# Patient Record
Sex: Female | Born: 1970 | Race: Black or African American | Hispanic: Yes | Marital: Married | Smoking: Never smoker
Health system: Southern US, Community
[De-identification: ages and names within clinical notes are randomized; demographics above are authoritative.]

## PROBLEM LIST (undated history)

## (undated) DIAGNOSIS — N200 Calculus of kidney: Secondary | ICD-10-CM

## (undated) DIAGNOSIS — R87619 Unspecified abnormal cytological findings in specimens from cervix uteri: Secondary | ICD-10-CM

## (undated) DIAGNOSIS — Z8619 Personal history of other infectious and parasitic diseases: Secondary | ICD-10-CM

## (undated) DIAGNOSIS — IMO0002 Reserved for concepts with insufficient information to code with codable children: Secondary | ICD-10-CM

## (undated) DIAGNOSIS — K219 Gastro-esophageal reflux disease without esophagitis: Secondary | ICD-10-CM

## (undated) HISTORY — DX: Gastro-esophageal reflux disease without esophagitis: K21.9

## (undated) HISTORY — PX: LEEP: SHX91

## (undated) HISTORY — DX: Calculus of kidney: N20.0

## (undated) HISTORY — DX: Reserved for concepts with insufficient information to code with codable children: IMO0002

## (undated) HISTORY — DX: Unspecified abnormal cytological findings in specimens from cervix uteri: R87.619

## (undated) HISTORY — DX: Personal history of other infectious and parasitic diseases: Z86.19

---

## 1991-11-24 HISTORY — PX: CHOLECYSTECTOMY: SHX55

## 1999-08-25 ENCOUNTER — Other Ambulatory Visit: Admission: RE | Admit: 1999-08-25 | Discharge: 1999-08-25 | Payer: Self-pay | Admitting: Obstetrics and Gynecology

## 2000-06-27 ENCOUNTER — Other Ambulatory Visit: Admission: RE | Admit: 2000-06-27 | Discharge: 2000-06-27 | Payer: Self-pay | Admitting: Obstetrics and Gynecology

## 2000-07-09 ENCOUNTER — Encounter: Payer: Self-pay | Admitting: Obstetrics and Gynecology

## 2000-07-09 ENCOUNTER — Inpatient Hospital Stay (HOSPITAL_COMMUNITY): Admission: AD | Admit: 2000-07-09 | Discharge: 2000-07-09 | Payer: Self-pay | Admitting: Obstetrics and Gynecology

## 2001-01-17 ENCOUNTER — Observation Stay (HOSPITAL_COMMUNITY): Admission: AD | Admit: 2001-01-17 | Discharge: 2001-01-17 | Payer: Self-pay | Admitting: Obstetrics and Gynecology

## 2001-01-17 ENCOUNTER — Encounter: Payer: Self-pay | Admitting: Obstetrics and Gynecology

## 2001-01-19 ENCOUNTER — Inpatient Hospital Stay (HOSPITAL_COMMUNITY): Admission: AD | Admit: 2001-01-19 | Discharge: 2001-01-22 | Payer: Self-pay | Admitting: Obstetrics and Gynecology

## 2001-01-25 ENCOUNTER — Inpatient Hospital Stay (HOSPITAL_COMMUNITY): Admission: AD | Admit: 2001-01-25 | Discharge: 2001-01-25 | Payer: Self-pay | Admitting: Obstetrics and Gynecology

## 2001-08-17 ENCOUNTER — Emergency Department (HOSPITAL_COMMUNITY): Admission: EM | Admit: 2001-08-17 | Discharge: 2001-08-17 | Payer: Self-pay | Admitting: *Deleted

## 2001-08-17 ENCOUNTER — Encounter: Payer: Self-pay | Admitting: *Deleted

## 2002-10-09 ENCOUNTER — Other Ambulatory Visit: Admission: RE | Admit: 2002-10-09 | Discharge: 2002-10-09 | Payer: Self-pay | Admitting: Obstetrics and Gynecology

## 2003-09-13 ENCOUNTER — Inpatient Hospital Stay (HOSPITAL_COMMUNITY): Admission: AD | Admit: 2003-09-13 | Discharge: 2003-09-14 | Payer: Self-pay | Admitting: Obstetrics and Gynecology

## 2003-11-09 ENCOUNTER — Other Ambulatory Visit: Admission: RE | Admit: 2003-11-09 | Discharge: 2003-11-09 | Payer: Self-pay | Admitting: Obstetrics and Gynecology

## 2003-12-04 ENCOUNTER — Inpatient Hospital Stay (HOSPITAL_COMMUNITY): Admission: AD | Admit: 2003-12-04 | Discharge: 2003-12-06 | Payer: Self-pay | Admitting: Obstetrics and Gynecology

## 2003-12-04 ENCOUNTER — Encounter (INDEPENDENT_AMBULATORY_CARE_PROVIDER_SITE_OTHER): Payer: Self-pay | Admitting: Specialist

## 2003-12-07 ENCOUNTER — Encounter: Admission: RE | Admit: 2003-12-07 | Discharge: 2004-01-06 | Payer: Self-pay | Admitting: Obstetrics and Gynecology

## 2003-12-11 ENCOUNTER — Inpatient Hospital Stay (HOSPITAL_COMMUNITY): Admission: AD | Admit: 2003-12-11 | Discharge: 2003-12-11 | Payer: Self-pay | Admitting: Obstetrics and Gynecology

## 2003-12-14 ENCOUNTER — Inpatient Hospital Stay (HOSPITAL_COMMUNITY): Admission: AD | Admit: 2003-12-14 | Discharge: 2003-12-14 | Payer: Self-pay | Admitting: Obstetrics and Gynecology

## 2004-01-07 ENCOUNTER — Encounter: Admission: RE | Admit: 2004-01-07 | Discharge: 2004-02-06 | Payer: Self-pay | Admitting: Obstetrics and Gynecology

## 2004-11-23 ENCOUNTER — Other Ambulatory Visit: Admission: RE | Admit: 2004-11-23 | Discharge: 2004-11-23 | Payer: Self-pay | Admitting: Obstetrics and Gynecology

## 2006-05-23 ENCOUNTER — Other Ambulatory Visit: Admission: RE | Admit: 2006-05-23 | Discharge: 2006-05-23 | Payer: Self-pay | Admitting: Obstetrics and Gynecology

## 2006-11-07 ENCOUNTER — Encounter: Payer: Self-pay | Admitting: Internal Medicine

## 2008-04-24 ENCOUNTER — Inpatient Hospital Stay (HOSPITAL_COMMUNITY): Admission: AD | Admit: 2008-04-24 | Discharge: 2008-04-26 | Payer: Self-pay | Admitting: Obstetrics and Gynecology

## 2008-04-28 ENCOUNTER — Encounter: Admission: RE | Admit: 2008-04-28 | Discharge: 2008-05-28 | Payer: Self-pay | Admitting: Obstetrics and Gynecology

## 2008-05-06 ENCOUNTER — Ambulatory Visit: Admission: RE | Admit: 2008-05-06 | Discharge: 2008-05-06 | Payer: Self-pay | Admitting: Obstetrics and Gynecology

## 2008-05-29 ENCOUNTER — Encounter: Admission: RE | Admit: 2008-05-29 | Discharge: 2008-06-08 | Payer: Self-pay | Admitting: Obstetrics and Gynecology

## 2009-06-15 ENCOUNTER — Encounter: Payer: Self-pay | Admitting: Emergency Medicine

## 2010-02-23 ENCOUNTER — Encounter: Payer: Self-pay | Admitting: Nurse Practitioner

## 2010-02-23 ENCOUNTER — Encounter: Admission: RE | Admit: 2010-02-23 | Discharge: 2010-02-23 | Payer: Self-pay | Admitting: Internal Medicine

## 2010-04-21 ENCOUNTER — Encounter: Payer: Self-pay | Admitting: Nurse Practitioner

## 2010-05-17 ENCOUNTER — Ambulatory Visit: Payer: Self-pay | Admitting: Vascular Surgery

## 2010-06-27 ENCOUNTER — Encounter: Payer: Self-pay | Admitting: Internal Medicine

## 2010-07-12 ENCOUNTER — Encounter
Admission: RE | Admit: 2010-07-12 | Discharge: 2010-07-12 | Payer: Self-pay | Source: Home / Self Care | Attending: Obstetrics and Gynecology | Admitting: Obstetrics and Gynecology

## 2010-11-29 NOTE — Consult Note (Signed)
NEW PATIENT CONSULTATION   Sarah Roman, Sarah Roman  DOB:  Nov 24, 1970                                       05/17/2010  ZOXWR#:[id number]   Patient is referred for swelling in her left ankle.  She has no history  of deep venous thrombosis, thrombophlebitis, or any blood clots in the  past.  Her first pregnancy was 9 years ago, and she developed some  swelling in the left ankle and foot the knee during the pregnancy, which  resolved following the pregnancy.  The second pregnancy, the same events  occurred, and on the third pregnancy a couple of years ago, she has  continued to have swelling in the ankle and foot on the left side.  She  does not have calf or thigh swelling.  She has no history of varicose  veins, stasis ulcers, bleeding, or other problems.  She is able to  ambulate long distances.   CHRONIC MEDICAL PROBLEMS:  Borderline hyperlipidemia.  Negative for  diabetes, hypertension, coronary artery disease, COPD, or stroke.   SOCIAL HISTORY:  She is a self-employed Airline pilot.  Is married, has 3  children.  Does not use tobacco or alcohol.   FAMILY HISTORY:  Positive for peripheral vascular disease and diabetes  in her mother.  Negative for coronary artery disease or stroke.   REVIEW OF SYSTEMS:  Positive for occasional diarrhea and constipation.  All other systems are negative in the complete review of systems.   PHYSICAL EXAMINATION:  Blood pressure 121/82, heart rate 72,  respirations 14.  General:  A well-developed and well-nourished female  in no apparent distress, alert and oriented x3.  HEENT:  Normal for age.  EOMs intact.  Lungs:  Clear to auscultation.  No rhonchi or wheezing.  Cardiovascular:  Regular rhythm.  No murmurs.  Carotid pulses are 3+.  No bruits.  Abdomen:  Soft, nontender with no palpable masses.  Musculoskeletal:  Free of major deformities.  Neurologic:  Normal.  Skin:  Free of rashes.  Lower extremity exam reveals 3+ femoral,  popliteal, and dorsalis pedis pulses.  No varicosities are noted.  There  is no hyperpigmentation or ulceration.  There is mild edema in the left  ankle which measures 1 cm, larger than the right side, with no calf or  thigh asymmetry.   Today I ordered a venous duplex exam, which I have reviewed and  interpreted.  She has some very mild reflux in her deep system on the  left.  There are no valvular __________ in the superficial system and no  deep venous obstruction.   I discussed these findings with patient and have recommended elevation  of her legs at night and to wear short-leg elastic compression stockings  during the day to help control the edema.  No other treatment or  evaluation is needed.  She will return to see [place] on p.r.Roman. basis.     Quita Skye Hart Rochester, M.D.  Electronically Signed   JDL/MEDQ  D:  05/17/2010  T:  05/18/2010  Job:  [id number]   cc:   Merlene Laughter. Renae Gloss, M.D.

## 2010-11-29 NOTE — Procedures (Signed)
LOWER EXTREMITY VENOUS REFLUX EXAM   INDICATION:  Left ankle edema.   EXAM:  Using color-flow imaging and pulse Doppler spectral analysis, the  left common femoral, superficial femoral, popliteal, posterior tibial,  greater and lesser saphenous veins are evaluated.  There is evidence  suggesting deep venous insufficiency in the left lower extremity.   The left saphenofemoral junction is competent. The left GSV is  competent.   The left proximal short saphenous vein demonstrates competency.   GSV Diameter (used if found to be incompetent only)                                            Right    Left  Proximal Greater Saphenous Vein           cm       cm  Proximal-to-mid-thigh                     cm       cm  Mid thigh                                 cm       cm  Mid-distal thigh                          cm       cm  Distal thigh                              cm       cm  Knee                                      cm       cm   IMPRESSION:  1. The left greater saphenous vein is competent.  2. The left greater saphenous vein is not tortuous.  3. The deep venous system is not competent with Reflux of      >560milliseconds.  4. The left short saphenous vein is competent.   ___________________________________________  Quita Skye. Hart [place], M.D.   EM/MEDQ  D:  05/17/2010  T:  05/18/2010  Job:  [id number]

## 2010-11-29 NOTE — H&P (Signed)
NAMEGARYN, WAGUESPACK          ACCOUNT NO.:  [id number]   MEDICAL RECORD NO.:  [id number]          PATIENT TYPE:  INP   LOCATION:  9108                          FACILITY:  [facility]   PHYSICIAN:  Janine Limbo, M.D.DATE OF BIRTH:  03/18/1971   DATE OF ADMISSION:  04/24/2008  DATE OF DISCHARGE:                              HISTORY & PHYSICAL   This is a 40 year old gravida 3, para 2-0-0-2 at 39 weeks, who presents  with pain and cramping and contractions.  Cervix was initially 3 cm and  progressed to 5 cm, although her contraction pattern is not consistent.  Pregnancy has been followed by Dr. Pennie Rushing and is remarkable for;  1. History of postpartum hypertension.  2. AMA.  3. Obesity.  4. History of LEEP.  5. HSV II.  6. Daughter has developmental delays.  7. Group B strep negative.   PRENATAL LABS:  Hemoglobin 12.3, platelets 367, blood type O positive,  antibody screen negative, sickle cell negative, RPR nonreactive, rubella  immune, hepatitis negative, HIV negative, Pap test normal, gonorrhea  negative, chlamydia negative, and cyst fibrosis negative.   HISTORY OF CURRENT PREGNANCY:  She had an ultrasound in the first  trimester to correct her dates.  First trimester screening was normal.  She declined amnio.  She had an ultrasound for anatomy at 18 weeks that  was normal.  Glucola at 27 weeks was also normal.  She had an ultrasound  at 29 weeks showing normal growth and normal fluid, and she was group B  strep negative at term.   OB HISTORY:  She had a vacuum extraction in 2002 of a female infant  weighing 6 pounds 13 ounces at 55 weeks' gestation with no  complications.   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  She had a history of elevated blood pressure in 1 week after  delivery with her first child.  She had a LEEP procedure in 1996 and  occasional episodes of cystitis.   SURGICAL HISTORY:  Remarkable for gallbladder in 1993,  wisdom teeth in  1997, and a LEEP in 1996.   FAMILY HISTORY:  Remarkable for an aunt with MI, parents and grandmother  with hypertension, mother with varicosities,  father with diabetes,  grandmother with diabetes, grandmother with stroke, and grandfather with  prostate cancer.   GENETIC HISTORY:  Remarkable for mother with polydactyly, father of the  baby has sickle cell trait positive, 2 cousins have mental retardation,  and her daughter has developmental delay.   SOCIAL HISTORY:  The patient is married to [place], who is involved and  supportive.  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  She denies alcohol, tobacco, or  drug use.   OBJECTIVE DATA:  VITAL SIGNS:  Stable, afebrile.  HEENT:  Within normal limits.  NECK:  Thyroid normal and not enlarged.  CHEST:  Clear to auscultation.  HEART:  Regular rate and rhythm.  ABDOMEN:  Gravid.  Vertex Leopold's exam shows reactive fetal heart rate with contractions  every 2-5 minutes, which  are mild.  Cervix changed from 3 cm to 5 cm and  is 90% effaced and -1 station with bulging membranes.  EXTREMITIES:  Within normal limits.   ASSESSMENT:  1. Intrauterine pregnancy at 39 weeks.  2. Labor.  3. Advanced dilation.   PLAN:  1. Admit to birthing suites per Dr. Stefano Gaul.  2. Routine MD orders.  3. Low-dose Pitocin  4. Epidural p.r.n.      Marie L. Mayford Knife, C.N.M.      ______________________________  Janine Limbo, M.D.    MLW/MEDQ  D:  04/24/2008  T:  04/25/2008  Job:  [id number]

## 2010-12-02 NOTE — H&P (Signed)
NAME:  Sarah Roman, Sarah Roman                    ACCOUNT NO.:  [id number]   MEDICAL RECORD NO.:  [id number]                   PATIENT TYPE:  INP   LOCATION:  9166                                 FACILITY:  [facility]   PHYSICIAN:  Hal Morales, M.D.             DATE OF BIRTH:  January 12, 1971   DATE OF ADMISSION:  12/04/2003  DATE OF DISCHARGE:                                HISTORY & PHYSICAL   HISTORY OF PRESENT ILLNESS:  Sarah Roman is a 40 year old married  black female gravida 2 para 1-0-0-1 at 38 and four-sevenths weeks who  presents to the office with uterine contractions approximately every 7  minutes.  She reports questionable leaking fluid since this morning.  She  denies any bleeding, reports positive fetal movement, and denies any HSV  signs or symptoms.  Her pregnancy has been followed by the [facility]  OB/GYN M.D. service and has been remarkable for:  1. History of LEEP.  2. History of HSV.  3. History of increased blood pressure after delivery.   Her prenatal labs were collected on May 18, 2003.  Hemoglobin 12.1;  hematocrit 37.4; platelets 393,000.  Blood type O positive, antibody  negative.  RPR nonreactive.  Rubella immune.  Hepatitis B surface antigen  negative.  Pap smear within normal limits from March 2004.  Gonorrhea  negative, chlamydia negative.  Quad screen from June 30, 2003 was within  normal limits.  Her 1-hour Glucola on September 02, 2003 was 93 and her RPR  was nonreactive at that time and her hemoglobin was 12.7.  Culture of the  vaginal tract for gonorrhea, chlamydia, and group B strep on November 09, 2003  were all negative.   HISTORY OF PRESENT PREGNANCY:  The patient presented for care at [facility] on May 18, 2003 at [redacted] weeks gestation.  Pregnancy  ultrasonography at [redacted] weeks gestation showed normal anatomy and growth  consistent with previous dating.  The patient began taking Valtrex at [redacted]  weeks gestation for HSV  prophylaxis.  The rest of her prenatal care was  unremarkable.   OBSTETRICAL HISTORY:  She is a gravida 2 para 1-0-0-1.  In July 2002 she had  a vacuum extraction for a female infant weighing 6 pounds 13 ounces at 39 and  five-sevenths weeks gestation after 10 hours in labor.  She had an epidural  for anesthesia and his name was Jalen and the vacuum extraction was  secondary to maternal exhaustion.   MEDICAL HISTORY:  She has no medication allergies.  She was diagnosed with  HSV 2 and has approximately two to three outbreaks per year.  She had  elevated blood pressures approximately 1 week after delivery of her first  child and was started on labetalol.  She has used oral contraceptives in the  past.  She had a LEEP procedure in 1996 with normal Pap smears since.  She  has an occasional episode of cystitis.  SURGICAL HISTORY:  Remarkable for gallbladder in 1993 and wisdom teeth  extraction in 1997.   FAMILY MEDICAL HISTORY:  Remarkable for maternal aunt with myocardial  infarction.  Both parents and maternal grandmother on medications for  hypertension.  Mother with varicosities.  Father with diabetes and maternal  grandmother with insulin-dependent diabetes.  Maternal grandmother with  history of stroke.  Paternal grandfather with prostate cancer.   GENETIC HISTORY:  Remarkable for mother with polydactyly.  Father of the  baby is sickle cell trait positive.  Two maternal cousins with mental  retardation.   SOCIAL HISTORY:  The patient is married to the father of the baby.  His name  is Earna Coder.  He is involved and supportive.  Both are college educated.  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  They  deny any alcohol, tobacco, or illicit drug use with the pregnancy.   OBJECTIVE DATA:  VITAL SIGNS:  Stable.  Blood pressure is 140/80; the  patient is afebrile.  HEENT:  Grossly within normal limits.  CHEST:  Clear to auscultation.  HEART:  Regular  rate and rhythm.  ABDOMEN:  Gravid in contour with fundal height extending approximately 38 cm  above the pubic symphysis.  Fetal heart rate was in the 150s with Doppler.  Uterine contractions palpate moderate, approximately every 7 minutes.  PELVIC:  Sterile speculum exam shows no evidence of ruptured membranes,  negative nitrazine, negative ferning, with a small amount of vaginal  diagnosis present.  No signs or symptoms of HSV lesion.  Cervical exam is 4-  5 cm, 100% effaced, vertex at -2, with slightly bulging bag of water.  EXTREMITIES:  Within normal limits   ASSESSMENT:  1. Intrauterine pregnancy at term.  2. Early active labor.  3. Group B streptococcus negative.   PLAN:  1. Admit to birthing suites per consult with Dr. Pennie Rushing.  2. Routine M.D. orders.  3. Anticipate spontaneous vaginal delivery.     Cam Hai, C.N.M.                     Hal Morales, M.D.    KS/MEDQ  D:  12/04/2003  T:  12/04/2003  Job:  [id number]

## 2010-12-02 NOTE — Discharge Summary (Signed)
[facility]  Patient:    Sarah Roman, Sarah Roman                 MRN: [id number] Adm. Date:  [id number] Disc. Date: 01/22/01 Attending:  Shaune Spittle Dictator:   Wynelle Bourgeois, C.N.M.                           Discharge Summary  ADMISSION DIAGNOSES:          1. Intrauterine pregnancy at 39 5/7 weeks.                               2. Abdominal and back pain.                               3. Scleral jaundice with elevated total                                  bilirubin.                               4. Spontaneous premature rupture of membranes at                                  term of unknown duration.                               5. Early labor.  DISCHARGE DIAGNOSES:          1. Intrauterine pregnancy at 39 5/7 weeks.                               2. Abdominal and back pain.                               3. Scleral jaundice with elevated total                                  bilirubin.                               4. Spontaneous premature rupture of membranes at                                  term of unknown duration.                               5. Early labor.                               6. Status post vacuum assisted vaginal delivery  of a viable female infant named Earley Abide                                  weighing 6 pounds 13 ounces, Apgars 9 and 9.  PROCEDURE:                    1. Epidural anesthesia.                               2. Vacuum assisted vaginal delivery.                               3. Repair of midline laceration.  HOSPITAL COURSE:              Patient was admitted with complaints of severe back and pelvic pain since Thursday.  The location of the back pain was migratory and had changed from her left side to her right side since her admission on January 17, 2001.  She had been evaluated on January 17, 2001 for similar pain and evaluations for nephrolithiasis and cholelithiasis  were inconclusive at that time.  Other laboratories were drawn including hepatitis and chemistries.  She had been observed for 24 hours and given analgesia and discharged home.  On this admission she was found to have a cervical dilation of 3 cm, 100% effacement, -1 station with a vertex presentation.  Upon further examination for HSV lesions (which were absent) she was found to have ruptured membranes, but was unaware of when this had occurred.  She did not have any fever or nausea or vomiting.  On admission to the hospital her laboratories were repeated and remarkable for a total bilirubin of 2.9.  Otherwise her chemistries were within normal limits and her hepatitis screens for A, B, and C from the previous admission were negative.  She was admitted and evaluated further by Dr. Pennie Rushing and found to progress in labor.  On July 7 she had progressed to complete dilation and pushed the vertex to a +3 station.  At that time the fetal heart rate had elevated to 160-170 with some variable decelerations with pushing and she was also developing a fever of 100.7 at that time and was given Unasyn for antibiotic prophylaxis.  She was offered a vacuum assisted vaginal delivery and decided to proceed with the same.  She was delivered of a viable female infant named Earley Abide over a second degree midline laceration with epidural anesthesia.  The baby weighed 6 pounds 13 ounces and had Apgars of 9 and 9 and was noted to have a foot cord x 1.  On postpartum day #1 she was doing well and breast-feeding without difficulties. She was ambulating and voiding without difficulties.  Vital signs were stable. Hemoglobin was 8.3.  Chest was clear.  Heart rate was regular.  Breasts were soft and nontender.  Abdomen was soft and nontender.  There was no further back pain.  Fundus was firm.  Lochia was within normal limits.  Extremities were within normal limits.  She received routine postpartum care and  on postpartum day #2 her examination remained within normal limits and she was deemed to have received the full benefit of her hospital stay.  A followup comprehensive metabolic chemistry panel was drawn prior to discharge and will be followed  in the office.  DISCHARGE LABORATORIES:       White blood cell 12.6, hemoglobin 8.3, hematocrit 23.7, platelets 258,000.  DISCHARGE MEDICATIONS:        1. Ibuprofen 600 mg p.o. q.6h. p.r.n.                               2. Micronor one p.o. q.d.  DISCHARGE INSTRUCTIONS:       Per [facility] handout.  DISCHARGE FOLLOW-UP:          Six weeks at [facility] or p.r.n. DD:  01/22/01 TD:  01/22/01 Job: [id number]

## 2010-12-02 NOTE — Op Note (Signed)
[facility]  Patient:    Sarah Roman, Sarah Roman                 MRN: [id number] Proc. Date: 01/20/01 Adm. Date:  [id number] Attending:  Dierdre Forth Pearline                           Operative Report  PREOPERATIVE DIAGNOSES:       1. Intrauterine pregnancy at term.                               2. Spontaneous rupture of membranes.                               3. Prolonged second stage of labor.                               4. Maternal exhaustion.                               5. Maternal fever.  POSTOPERATIVE DIAGNOSES:      1. Intrauterine pregnancy at term.                               2. Spontaneous rupture of membranes.                               3. Prolonged second stage of labor.                               4. Maternal exhaustion.                               5. Maternal fever.  OPERATIONS:                   1. Vacuum-assisted vaginal delivery.                               2. Repair of second degree midline laceration.  SURGEON:                      Vanessa P. Pennie Rushing, M.D.  ANESTHESIA:                   Epidural.  ESTIMATED BLOOD LOSS:         Less than 500 cc.  COMPLICATIONS:                None.  FINDINGS:                     The patient was delivered of a female infant whose name is Leanora Cover, with the weight pending at the time of dictation. Apgars were 9 and 9 at one and five minutes respectively.  There was a cord around the foot.  DESCRIPTION OF PROCEDURE:     The patient had been pushing for a total of 3 hours.  At 2-1/2 hours,  a discussion had been held with the patient concerning options for further management.  The vertex was at +3 to +4 and, at that time, she wished to continue pushing rather than proceeding with vacuum-assisted vaginal delivery or cesarean section.  She continued to push for another 30 minutes and again, because of maternal exhaustion, made the decision to proceed with vacuum-assisted  vaginal delivery.  The risks of fetal scalp trauma and maternal tissue trauma were explained, as were the risks of inability to complete vaginal delivery after vacuum-assisted delivery of the fetal head.  She and her husband seemed to understand, had their questions answered, and wished to proceed.  The patient was in the lithotomy position. The perineum was prepped and draped.  A red Robinson catheter was used to empty the bladder.  The Kiwi vacuum system was then placed on the fetal vertex and, over the next two contractions, the fetal head was delivered over the intact perineum with a second degree laceration.  The Kiwi had been pumped to the high green level of vacuum and released between contractions.  The nares and pharynx were suctioned with DeLee suction, producing approximately 7 cc of clear fluid.  The remainder of the infant was delivered with gentle traction and maternal expulsive efforts.  The cord was clamped then cut by the father of the baby.  The infant was placed on the mothers abdomen.  The appropriate cord blood was drawn.  The placenta was noted to spontaneously release from the uterus and, with gentle traction, was delivered.  The perineal laceration was repaired in the usual fashion with a suture of 3-0 Vicryl.  An ice pack was placed on the perineum and the mother and infant left in the LDR room for bonding. DD:  01/20/01 TD:  01/20/01 Job: [id number]
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
## 2010-12-02 NOTE — Discharge Summary (Signed)
[facility]  Patient:    Sarah Roman, Sarah Roman                 MRN: [id number] Adm. Date:  [id number] Disc. Date: [id number] Attending:  Leonard Schwartz Dictator:   Nigel Bridgeman, C.N.M.                           Discharge Summary  ADMITTING DIAGNOSES:          1. Intrauterine pregnancy at term.                               2. Left flank pain.  DISCHARGE DIAGNOSES:          1. Intrauterine pregnancy at term.                               2. Left flank pain.  PROCEDURES:                   1. IV hydration.                               2. Clean catch urine.                               3. Laboratory work.                               4. Renal and abdominal ultrasound.  HOSPITAL COURSE:              The patient is a 40 year old gravida 1, para 0 at 39-3/7 weeks who presented with a 24 hour history of left-sided back pain. Pregnancy has been followed for: 1) History of HSV II.  2)  History of leak. 3) Father of baby with sickle cell trait.  Cervix was 1-2, 90% vertex and -1 on admission.  Clean catch urine showed small blood, small bilirubin and positive ______ test.  The patient was admitted with possible diagnosis of kidney stone.  IV hydration was initiated.  Urine straining was begun. Patient was begun on Stadol and Phenergan x 1 and Ibuprofen.  For the rest of the day all labs were within normal limits.  Fetal heart rate remained reassuring and reactive.  There were three episodes of single decelerations, these were not repetitive, there is no clear etiology.  CBC, comprehensive metabolic profile were within normal limits.  Renal ultrasound was performed and it showed right pyelectasis and left calicectasis.  At the end of the late evening, the patient tolerated a regular diet, her flank pain had improved, she was afebrile and her vital signs were stable.  The decision was made to discharge the patient to home per Dr. Charleen Kirks  order.  DISCHARGE MEDICATIONS:  Ibuprofen 600 mg p.o. q.6h. p.r.n. pain.  DISCHARGE INSTRUCTIONS:  Patient is to increase her rest and push p.o. fluids. She is to take the Ibuprofen on an as needed basis.  DISCHARGE FOLLOWUP:  Will occur on Monday January 21, 2001, at [place]. DD:  01/17/01 TD:  01/18/01 Job: [id number]

## 2010-12-02 NOTE — Discharge Summary (Signed)
NAME:  [name], [name]                    ACCOUNT NO.:  [id number]   MEDICAL RECORD NO.:  [id number]                   PATIENT TYPE:  INP   LOCATION:  9128                                 FACILITY:  [facility]   PHYSICIAN:  Hal Morales, M.D.             DATE OF BIRTH:  04/28/1971   DATE OF ADMISSION:  12/04/2003  DATE OF DISCHARGE:  12/06/2003                                 DISCHARGE SUMMARY   ADMITTING DIAGNOSIS:  Intrauterine pregnancy at term in labor.   PROCEDURE:  Normal spontaneous vaginal delivery with repair of second degree  midline laceration.   DISCHARGE DIAGNOSES:  1. Intrauterine pregnancy at term, delivered.  2. Normal spontaneous vaginal delivery.  3. Repair of second degree midline laceration.  4. Meconium-stained amniotic fluid.  5. Proteinuria.  6. Elevated bilirubin.   Ms. Sarah Roman is a 40 year old gravida 2 para 1-0-0-1 who presents in  labor.  She went on to have a normal spontaneous vaginal delivery with Dr.  Dierdre Forth at 2051 on Dec 04, 2003.  She had a 7-pound 14-ounce female  infant with Apgar scores of 7 at one minute and 9 at five minutes.  This  patient's postpartum course has been essentially unremarkable.  She spiked a  temperature of 101 just after delivery and has been afebrile since that  time.  The baby has been evaluated in the NICU for tachypnea and is on  antibiotics.  The patient had normal blood pressures throughout her labor  and delivery.  However, 30 mg/dl of protein was found on a catheterized  urinalysis and her complete metabolic panel found hyperbilirubinemia.  A 24-  hour urine was begun postpartum and completed at 5:30 a.m. this morning; the  results are pending.  Otherwise, the patient's vital signs have remained  stable.  Her blood pressures have been 100s to 120s over 60s to 80s.  On  this her postpartum day #2 she is doing well and awaiting discharge pending  her 24-hour urine.  Her bilirubin has remained elevated  although somewhat  decreased - 3.9 on May 21 and 3.1 today.  The patient will check a complete  metabolic panel for hyperbilirubinemia just prior to her 6-week postpartum  visit.  PIH symptoms were reviewed with her.  She has received [facility] handout  for discharge instructions and prescriptions for Motrin and a prescription  for her blood work.  She will call the office of [facility] for any further  problems or concerns.  Awaiting discharge per Dr. Dierdre Forth.     Rica Koyanagi, C.N.M.               Hal Morales, M.D.    SDM/MEDQ  D:  12/06/2003  T:  12/07/2003  Job:  [id number]

## 2010-12-02 NOTE — H&P (Signed)
[facility]  Patient:    Sarah Roman, Sarah Roman                 MRN: [id number] Adm. Date:  [id number] Attending:  Shaune Spittle Dictator:   Wynelle Bourgeois, CNM                         History and Physical  HISTORY OF PRESENT ILLNESS:   This is a 40 year old, G1, P0, at 39-5/7 weeks, who presents with complaints of back and pelvic pain since Thursday.  She was seen here on Thursday for the same pain and evaluated for nephrolithiasis and this evaluation was inconclusive at that time.  Other laboratories were drawn, including hepatitis and chemistries.  She was observed for 23 hours and given analgesia and IV fluids and then discharge home.  Her cervix at that time was 1 cm to 2 cm and 90% effaced.  She denies any current fever or nausea or vomiting.  She states that her upper back pain has moved from the left to the right.  PRENATAL LABORATORY DATA:     Hemoglobin 12.0, hematocrit 37.0, platelets 367. Blood type B positive, antibody screen negative, sickle cell negative, RPR nonreactive, rubella titer not recorded.  HBsAg is negative.  Hepatitis normal.  Gonorrhea negative and Chlamydia negative.  AFP free-beta within normal limits.  Glucose challenge within normal limits.  Her total bilirubin today is 2.9.  Otherwise, chemistries are within normal limits.  PAST MEDICAL HISTORY:         Remarkable for history of abnormal Pap smear with a LEEP in 1996.  History of HSV, diagnosed in 1998.  PAST SURGICAL HISTORY:        Cholecystectomy in 1993, wisdom teeth removal in 1997.  FAMILY HISTORY:               Remarkable for a maternal aunt and uncle with heart attacks.  Mother and father and grandmother with hypertension.  Father, paternal grandmother, and cousins with diabetes.  Paternal grandfather with prostate cancer.  Maternal grandmother with stroke.  GENETIC HISTORY:              Remarkable for patients mother who has an extra finger.   Maternal cousins and maternal aunt with mental retardation.  Father of the baby with sickle cell trait.  SOCIAL HISTORY:               The patient is married to [employer] [facility] [employer], who is involved and supportive.  She is of the Episcopalian faith.  She denies any alcohol, tobacco, or drug use.  PHYSICAL EXAMINATION:  VITAL SIGNS:                  Stable.  Patient is afebrile.  HEENT:                        Within normal limits, except for mild scleral jaundice.  Thyroid normal and not enlarged.  CHEST:                        Clear to auscultation.  HEART:                        Regular rate and rhythm.  BREASTS:                      Soft and  nontender.  ABDOMEN:                      Gravid at 40 cm, difficult to assess for tenderness.  BACK:                         Generally tender throughout to light and deep palpation.  Questionable CVA tenderness on right.  FETAL MONITORING:             Electronic fetal monitoring shows uterine irritability with a reactive fetal heart rate tracing.  She had a single deceleration to the 90s, with spontaneous return to baseline and no further decelerations after that.  There are accelerations and good variability.  PELVIC:                       Cervical exam is 3 cm, 100%, and -1 station with a vertex presentation and the cervix is anterior.  EXTREMITIES:  Within normal limits.  ASSESSMENT:                   1.  Intrauterine pregnancy at 39-5/7 weeks.                               2.  Abdominal and back pain.                               3.  Scleral jaundice with elevated total                                   bilirubin.  PLAN:                         1.  Admit to birthing suite, per Dr. Pennie Rushing.                               2.  Routine M.D. orders.                               3.  Dr. Pennie Rushing to evaluate and make further                                   plan. DD:  01/19/01 TD:  01/19/01 Job: [id number]

## 2011-04-17 LAB — URINE MICROSCOPIC-ADD ON

## 2011-04-17 LAB — CBC
HCT: 33.4 — ABNORMAL LOW
HCT: 36.7
MCV: 88.9
Platelets: 276
Platelets: 302
RDW: 15
WBC: 12.8 — ABNORMAL HIGH

## 2011-04-17 LAB — URINE CULTURE

## 2011-04-17 LAB — URINALYSIS, ROUTINE W REFLEX MICROSCOPIC
Hgb urine dipstick: NEGATIVE
Ketones, ur: 15 — AB
Leukocytes, UA: NEGATIVE
Protein, ur: 30 — AB

## 2011-08-14 ENCOUNTER — Other Ambulatory Visit: Payer: Self-pay | Admitting: Obstetrics and Gynecology

## 2011-08-14 DIAGNOSIS — Z1231 Encounter for screening mammogram for malignant neoplasm of breast: Secondary | ICD-10-CM

## 2011-08-23 ENCOUNTER — Ambulatory Visit
Admission: RE | Admit: 2011-08-23 | Discharge: 2011-08-23 | Disposition: A | Discharge status: Home / Self Care | Payer: 59 | Source: Ambulatory Visit | Attending: Obstetrics and Gynecology | Admitting: Obstetrics and Gynecology

## 2011-08-23 DIAGNOSIS — Z1231 Encounter for screening mammogram for malignant neoplasm of breast: Secondary | ICD-10-CM

## 2011-08-30 ENCOUNTER — Other Ambulatory Visit: Payer: Self-pay | Admitting: Obstetrics and Gynecology

## 2011-08-30 DIAGNOSIS — R928 Other abnormal and inconclusive findings on diagnostic imaging of breast: Secondary | ICD-10-CM

## 2011-09-07 ENCOUNTER — Ambulatory Visit
Admission: RE | Admit: 2011-09-07 | Discharge: 2011-09-07 | Disposition: A | Discharge status: Home / Self Care | Payer: 59 | Source: Ambulatory Visit | Attending: Obstetrics and Gynecology | Admitting: Obstetrics and Gynecology

## 2011-09-07 DIAGNOSIS — R928 Other abnormal and inconclusive findings on diagnostic imaging of breast: Secondary | ICD-10-CM

## 2011-12-15 ENCOUNTER — Encounter: Payer: Self-pay | Admitting: Internal Medicine

## 2012-07-19 DIAGNOSIS — Z8619 Personal history of other infectious and parasitic diseases: Secondary | ICD-10-CM | POA: Insufficient documentation

## 2012-07-23 ENCOUNTER — Ambulatory Visit: Payer: Self-pay | Admitting: Obstetrics and Gynecology

## 2012-07-31 ENCOUNTER — Other Ambulatory Visit: Payer: Self-pay

## 2012-07-31 NOTE — Telephone Encounter (Signed)
Received BC refill request from [facility]. Attempted both contact ph #'s unable to LM on VM.

## 2012-08-01 NOTE — Telephone Encounter (Signed)
Spoke with pt informing her we received En[facility]se BC request from [facility]. Pt states she does need the refill and hasn't missed any pills. Aex is scheduled 08/28/12 with [facility]. Enpresse - 28 tab 1 po qd called to [facility] no refills. Pt agrees and voices understanding.

## 2012-08-27 ENCOUNTER — Telehealth: Payer: Self-pay

## 2012-08-27 DIAGNOSIS — IMO0001 Reserved for inherently not codable concepts without codable children: Secondary | ICD-10-CM

## 2012-08-27 MED ORDER — LEVONORG-ETH ESTRAD TRIPHASIC PO TABS: 1.0000 | ORAL_TABLET | Freq: Every day | ORAL | Status: DC

## 2012-08-27 NOTE — Telephone Encounter (Signed)
Spoke with pt needs BC rf. Last AEX 07/25/11 and pt hasn't missed any pills. Pt unable to make AEX due to [facility] March schedule is unavailable. Pt states she spoke with someone earlier who will call her as soon [facility] gets a March schedule. Trivora sent to pharmacy with no refills per protocol. Pt agrees and voices understanding.

## 2012-08-27 NOTE — Telephone Encounter (Signed)
LM for pt to c/b regarding her request for [facility] refill

## 2012-08-28 ENCOUNTER — Ambulatory Visit: Payer: Self-pay | Admitting: Obstetrics and Gynecology

## 2012-09-27 ENCOUNTER — Other Ambulatory Visit: Payer: Self-pay | Admitting: Obstetrics and Gynecology

## 2012-09-27 ENCOUNTER — Telehealth: Payer: Self-pay | Admitting: Obstetrics and Gynecology

## 2012-09-27 DIAGNOSIS — IMO0001 Reserved for inherently not codable concepts without codable children: Secondary | ICD-10-CM

## 2012-09-27 MED ORDER — LEVONORG-ETH ESTRAD TRIPHASIC PO TABS: 1.0000 | ORAL_TABLET | Freq: Every day | ORAL | Status: AC

## 2012-09-27 NOTE — Telephone Encounter (Signed)
TC to pt. States needs to start new pack Trivora 09/29/12. Denies any new medical problems. Has annual 10/09/12. Per protocol 1 pack E-scribed.

## 2012-09-27 NOTE — Telephone Encounter (Signed)
VM from pt. Requesting RF OCP> Has app 10/09/12. [facility].  Pt [phone]

## 2012-10-14 DIAGNOSIS — E669 Obesity, unspecified: Secondary | ICD-10-CM | POA: Insufficient documentation

## 2013-01-28 ENCOUNTER — Encounter: Payer: Self-pay | Admitting: Internal Medicine

## 2013-05-01 ENCOUNTER — Encounter: Payer: Self-pay | Admitting: Internal Medicine

## 2013-12-25 ENCOUNTER — Ambulatory Visit: Payer: 59 | Attending: Family Medicine

## 2013-12-25 DIAGNOSIS — M25579 Pain in unspecified ankle and joints of unspecified foot: Secondary | ICD-10-CM | POA: Insufficient documentation

## 2013-12-25 DIAGNOSIS — IMO0001 Reserved for inherently not codable concepts without codable children: Secondary | ICD-10-CM | POA: Insufficient documentation

## 2014-01-06 ENCOUNTER — Ambulatory Visit: Payer: 59 | Admitting: Rehabilitation

## 2014-01-12 ENCOUNTER — Ambulatory Visit: Payer: 59 | Admitting: Rehabilitation

## 2014-01-15 ENCOUNTER — Ambulatory Visit: Payer: 59 | Attending: Family Medicine | Admitting: Rehabilitation

## 2014-01-15 DIAGNOSIS — IMO0001 Reserved for inherently not codable concepts without codable children: Secondary | ICD-10-CM | POA: Insufficient documentation

## 2014-01-15 DIAGNOSIS — M25579 Pain in unspecified ankle and joints of unspecified foot: Secondary | ICD-10-CM | POA: Insufficient documentation

## 2014-01-19 ENCOUNTER — Ambulatory Visit: Payer: 59 | Admitting: Rehabilitation

## 2014-01-21 ENCOUNTER — Ambulatory Visit: Payer: 59 | Admitting: Rehabilitation

## 2014-01-26 ENCOUNTER — Ambulatory Visit: Payer: 59 | Admitting: Rehabilitation

## 2014-01-29 ENCOUNTER — Ambulatory Visit: Payer: 59

## 2014-02-02 ENCOUNTER — Ambulatory Visit: Payer: 59 | Admitting: Rehabilitation

## 2014-02-04 ENCOUNTER — Ambulatory Visit: Payer: 59 | Admitting: Rehabilitation

## 2014-02-04 ENCOUNTER — Encounter: Payer: 59 | Admitting: Physical Therapy

## 2014-02-09 ENCOUNTER — Encounter: Payer: Self-pay | Admitting: Family

## 2014-03-02 ENCOUNTER — Other Ambulatory Visit: Payer: Self-pay

## 2014-03-02 DIAGNOSIS — Z1231 Encounter for screening mammogram for malignant neoplasm of breast: Secondary | ICD-10-CM

## 2014-03-17 ENCOUNTER — Ambulatory Visit: Payer: 59

## 2014-03-26 ENCOUNTER — Ambulatory Visit
Admission: RE | Admit: 2014-03-26 | Discharge: 2014-03-26 | Disposition: A | Discharge status: Home / Self Care | Payer: 59 | Source: Ambulatory Visit

## 2014-03-26 ENCOUNTER — Encounter (INDEPENDENT_AMBULATORY_CARE_PROVIDER_SITE_OTHER): Payer: Self-pay

## 2014-03-26 DIAGNOSIS — Z1231 Encounter for screening mammogram for malignant neoplasm of breast: Secondary | ICD-10-CM

## 2014-12-17 LAB — HM PAP SMEAR: HM Pap smear: NORMAL

## 2015-02-15 ENCOUNTER — Encounter: Payer: Self-pay | Admitting: Family

## 2015-03-15 ENCOUNTER — Other Ambulatory Visit: Payer: Self-pay

## 2015-03-15 DIAGNOSIS — Z1231 Encounter for screening mammogram for malignant neoplasm of breast: Secondary | ICD-10-CM

## 2015-03-31 ENCOUNTER — Ambulatory Visit
Admission: RE | Admit: 2015-03-31 | Discharge: 2015-03-31 | Disposition: A | Discharge status: Home / Self Care | Payer: 59 | Source: Ambulatory Visit

## 2015-03-31 DIAGNOSIS — Z1231 Encounter for screening mammogram for malignant neoplasm of breast: Secondary | ICD-10-CM

## 2015-09-06 ENCOUNTER — Encounter: Payer: Self-pay | Admitting: Nurse Practitioner

## 2015-09-06 DIAGNOSIS — R079 Chest pain, unspecified: Secondary | ICD-10-CM | POA: Diagnosis not present

## 2015-09-06 DIAGNOSIS — G4700 Insomnia, unspecified: Secondary | ICD-10-CM | POA: Diagnosis not present

## 2015-09-06 DIAGNOSIS — R7309 Other abnormal glucose: Secondary | ICD-10-CM | POA: Diagnosis not present

## 2015-12-22 DIAGNOSIS — R51 Headache: Secondary | ICD-10-CM | POA: Diagnosis not present

## 2015-12-22 DIAGNOSIS — Z01419 Encounter for gynecological examination (general) (routine) without abnormal findings: Secondary | ICD-10-CM | POA: Diagnosis not present

## 2015-12-22 DIAGNOSIS — Z124 Encounter for screening for malignant neoplasm of cervix: Secondary | ICD-10-CM | POA: Diagnosis not present

## 2015-12-22 DIAGNOSIS — Z6834 Body mass index (BMI) 34.0-34.9, adult: Secondary | ICD-10-CM | POA: Diagnosis not present

## 2015-12-22 DIAGNOSIS — B009 Herpesviral infection, unspecified: Secondary | ICD-10-CM | POA: Diagnosis not present

## 2015-12-22 DIAGNOSIS — Z3040 Encounter for surveillance of contraceptives, unspecified: Secondary | ICD-10-CM | POA: Diagnosis not present

## 2016-02-17 ENCOUNTER — Encounter: Payer: Self-pay | Admitting: Nurse Practitioner

## 2016-02-17 ENCOUNTER — Other Ambulatory Visit: Payer: Self-pay | Admitting: Obstetrics and Gynecology

## 2016-02-17 DIAGNOSIS — E559 Vitamin D deficiency, unspecified: Secondary | ICD-10-CM | POA: Diagnosis not present

## 2016-02-17 DIAGNOSIS — G4700 Insomnia, unspecified: Secondary | ICD-10-CM | POA: Diagnosis not present

## 2016-02-17 DIAGNOSIS — R7309 Other abnormal glucose: Secondary | ICD-10-CM | POA: Diagnosis not present

## 2016-02-17 DIAGNOSIS — Z0000 Encounter for general adult medical examination without abnormal findings: Secondary | ICD-10-CM | POA: Diagnosis not present

## 2016-02-17 DIAGNOSIS — Z1231 Encounter for screening mammogram for malignant neoplasm of breast: Secondary | ICD-10-CM

## 2016-03-03 DIAGNOSIS — H2513 Age-related nuclear cataract, bilateral: Secondary | ICD-10-CM | POA: Diagnosis not present

## 2016-03-03 DIAGNOSIS — H52222 Regular astigmatism, left eye: Secondary | ICD-10-CM | POA: Diagnosis not present

## 2016-03-03 DIAGNOSIS — H52221 Regular astigmatism, right eye: Secondary | ICD-10-CM | POA: Diagnosis not present

## 2016-03-03 DIAGNOSIS — H524 Presbyopia: Secondary | ICD-10-CM | POA: Diagnosis not present

## 2016-03-03 DIAGNOSIS — H53021 Refractive amblyopia, right eye: Secondary | ICD-10-CM | POA: Diagnosis not present

## 2016-03-03 DIAGNOSIS — H5201 Hypermetropia, right eye: Secondary | ICD-10-CM | POA: Diagnosis not present

## 2016-04-03 ENCOUNTER — Encounter: Payer: Self-pay | Admitting: Radiology

## 2016-04-03 ENCOUNTER — Ambulatory Visit
Admission: RE | Admit: 2016-04-03 | Discharge: 2016-04-03 | Disposition: A | Discharge status: Home / Self Care | Payer: 59 | Source: Ambulatory Visit | Attending: Obstetrics and Gynecology | Admitting: Obstetrics and Gynecology

## 2016-04-03 DIAGNOSIS — Z1231 Encounter for screening mammogram for malignant neoplasm of breast: Secondary | ICD-10-CM | POA: Diagnosis not present

## 2016-08-18 ENCOUNTER — Ambulatory Visit: Payer: Self-pay | Admitting: Family Medicine

## 2016-09-04 ENCOUNTER — Ambulatory Visit (INDEPENDENT_AMBULATORY_CARE_PROVIDER_SITE_OTHER): Payer: 59 | Admitting: Family Medicine

## 2016-09-04 ENCOUNTER — Encounter: Payer: Self-pay | Admitting: Family Medicine

## 2016-09-04 VITALS — BP 126/86 | HR 72 | Temp 98.4°F | Resp 16 | Ht 62.5 in | Wt 189.0 lb

## 2016-09-04 DIAGNOSIS — K219 Gastro-esophageal reflux disease without esophagitis: Secondary | ICD-10-CM | POA: Diagnosis not present

## 2016-09-04 DIAGNOSIS — Z23 Encounter for immunization: Secondary | ICD-10-CM

## 2016-09-04 DIAGNOSIS — G43829 Menstrual migraine, not intractable, without status migrainosus: Secondary | ICD-10-CM | POA: Diagnosis not present

## 2016-09-04 DIAGNOSIS — R7309 Other abnormal glucose: Secondary | ICD-10-CM | POA: Diagnosis not present

## 2016-09-04 DIAGNOSIS — Z833 Family history of diabetes mellitus: Secondary | ICD-10-CM | POA: Diagnosis not present

## 2016-09-04 DIAGNOSIS — Z7689 Persons encountering health services in other specified circumstances: Secondary | ICD-10-CM

## 2016-09-04 NOTE — Progress Notes (Signed)
Chief Complaint  Patient presents with  . Establish Care   Patient is here new to establish Old records are not available She states her Pap and mammogram are up-to-date She agrees to getting a flu shot today She has not had blood work in over a year. She is a strong family history for diabetes. She is not on any particular diet, no regular exercise. Moderately overweight. She's had some family stress recently, her niece died in her home. She's had more GERD, and acid at night. Discussed home management.   Patient Active Problem List   Diagnosis Date Noted  . History of herpes simplex infection     Outpatient Encounter Prescriptions as of 09/04/2016  Medication Sig  . levonorgestrel-ethinyl estradiol (ENPRESSE,TRIVORA) tablet Take 1 tablet by mouth daily.  . valACYclovir (VALTREX) 500 MG tablet Take 500 mg by mouth 2 (two) times daily.   No facility-administered encounter medications on file as of 09/04/2016.     Past Medical History:  Diagnosis Date  . Abnormal Pap smear   . GERD (gastroesophageal reflux disease)   . History of herpes simplex infection     Past Surgical History:  Procedure Laterality Date  . CHOLECYSTECTOMY  11/24/1991  . LEEP      Social History   Social History  . Marital status: Married    Spouse name: Geographical information systems officer  . Number of children: 3  . Years of education: 71   Occupational History  . accountant     self   Social History Main Topics  . Smoking status: Never Smoker  . Smokeless tobacco: Never Used  . Alcohol use Yes  . Drug use: No  . Sexual activity: Yes    Birth control/ protection: Pill   Other Topics Concern  . Not on file   Social History Narrative   Masters degree in Physicist, medical works for [employer]   3 children at home   2 nephews in addition   Niece just passed away from choking in the home       Family History  Problem Relation Age of Onset  . Stroke Maternal Grandmother   . Diabetes Maternal  Grandmother   . Kidney disease Mother     dialysis, died at 21  . Diabetes Mother   . Heart disease Mother   . Hyperlipidemia Mother   . Hypertension Mother   . Cancer Father     prostate  . Heart disease Father     died at 39  . Diabetes Father   . Hypertension Father   . Hyperlipidemia Father   . Early death Maternal Grandfather     PE after surgery  . Diabetes Paternal Grandmother   . Cancer Paternal Grandfather     prostate  . Early death Other 11    choking    Review of Systems  Constitutional: Negative for chills, fever and weight loss.  HENT: Negative for congestion and hearing loss.   Eyes: Negative for blurred vision and pain.  Respiratory: Negative for cough and shortness of breath.   Cardiovascular: Negative for chest pain and leg swelling.  Gastrointestinal: Positive for heartburn. Negative for abdominal pain, constipation and diarrhea.  Genitourinary: Negative for dysuria and frequency.  Musculoskeletal: Negative for falls, joint pain and myalgias.  Neurological: Positive for headaches. Negative for dizziness and seizures.  Psychiatric/Behavioral: Negative for depression. The patient is not nervous/anxious and does not have insomnia.    BP 126/86 (BP Location: Right Arm, Patient Position:  Sitting, Cuff Size: Normal)   Pulse 72   Temp 98.4 F (36.9 C) (Temporal)   Resp 16   Ht 5' 2.5" (1.588 m)   Wt 189 lb (85.7 kg)   LMP 08/21/2016 (Exact Date)   SpO2 100%   BMI 34.02 kg/m   Physical Exam  Constitutional: She is oriented to person, place, and time. She appears well-developed and well-nourished.  HENT:  Head: Normocephalic and atraumatic.  Right Ear: External ear normal.  Left Ear: External ear normal.  Mouth/Throat: Oropharynx is clear and moist.  Eyes: Conjunctivae are normal. Pupils are equal, round, and reactive to light.  Neck: Normal range of motion. Neck supple. No thyromegaly present.  Cardiovascular: Normal rate, regular rhythm and normal  heart sounds.   Pulmonary/Chest: Effort normal and breath sounds normal. No respiratory distress.  Abdominal: Soft. Bowel sounds are normal.  Musculoskeletal: Normal range of motion. She exhibits no edema.  Lymphadenopathy:    She has no cervical adenopathy.  Neurological: She is alert and oriented to person, place, and time. She displays normal reflexes.  Gait normal  Skin: Skin is warm and dry.  Psychiatric: She has a normal mood and affect. Her behavior is normal. Thought content normal.  Nursing note and vitals reviewed. ASSESSMENT/PLAN:  1. Need for influenza vaccination  - Flu Vaccine QUAD 36+ mos IM  2. Menstrual migraine without status migrainosus, not intractable  3. Gastroesophageal reflux disease, esophagitis presence not specified  4. Family history of diabetes mellitus  - CBC - Comprehensive metabolic panel - Hemoglobin A1c - Lipid panel - Urinalysis, Routine w reflex microscopic - VITAMIN D 25 Hydroxy (Vit-D Deficiency, Fractures)  5. Elevated hemoglobin A1c measurement   Patient Instructions  Need old records  Continue to eat well and try to get exercise  I have ordered lab testing  I will send you a letter with your test results.  If there is anything of concern, we will call right away.  Consider an antacid at bedtime Consider elevating head of bed  See me in a month     Eustace Moore, MD

## 2016-09-04 NOTE — Patient Instructions (Addendum)
Need old records  Continue to eat well and try to get exercise  I have ordered lab testing  I will send you a letter with your test results.  If there is anything of concern, we will call right away.  Consider an antacid at bedtime Consider elevating head of bed  See me in a month

## 2016-09-05 LAB — LIPID PANEL
Cholesterol: 132 mg/dL (ref <200)
HDL: 26 mg/dL — ABNORMAL LOW (ref >50)
LDL CALC: 68 mg/dL (ref <100)
TRIGLYCERIDES: 190 mg/dL — AB (ref <150)
Total CHOL/HDL Ratio: 5.1 Ratio — ABNORMAL HIGH (ref <5.0)
VLDL: 38 mg/dL — ABNORMAL HIGH (ref <30)

## 2016-09-05 LAB — URINALYSIS, ROUTINE W REFLEX MICROSCOPIC
Glucose, UA: NEGATIVE
Hgb urine dipstick: NEGATIVE
Ketones, ur: NEGATIVE
NITRITE: NEGATIVE
PROTEIN: NEGATIVE
Specific Gravity, Urine: 1.022 (ref 1.001–1.035)
pH: 5.5 (ref 5.0–8.0)

## 2016-09-05 LAB — COMPREHENSIVE METABOLIC PANEL
ALK PHOS: 54 U/L (ref 33–115)
ALT: 33 U/L — AB (ref 6–29)
AST: 17 U/L (ref 10–35)
Albumin: 4 g/dL (ref 3.6–5.1)
BUN: 7 mg/dL (ref 7–25)
CO2: 25 mmol/L (ref 20–31)
Calcium: 9.2 mg/dL (ref 8.6–10.2)
Chloride: 103 mmol/L (ref 98–110)
Creat: 0.61 mg/dL (ref 0.50–1.10)
Glucose, Bld: 115 mg/dL — ABNORMAL HIGH (ref 65–99)
POTASSIUM: 4.1 mmol/L (ref 3.5–5.3)
Sodium: 137 mmol/L (ref 135–146)
TOTAL PROTEIN: 6.9 g/dL (ref 6.1–8.1)
Total Bilirubin: 2.8 mg/dL — ABNORMAL HIGH (ref 0.2–1.2)

## 2016-09-05 LAB — URINALYSIS, MICROSCOPIC ONLY
Casts: NONE SEEN [LPF]
Crystals: NONE SEEN [HPF]
RBC / HPF: NONE SEEN RBC/HPF (ref <=2)

## 2016-09-05 LAB — CBC
HEMATOCRIT: 36.6 % (ref 35.0–45.0)
Hemoglobin: 12 g/dL (ref 11.7–15.5)
MCH: 29.6 pg (ref 27.0–33.0)
MCHC: 32.8 g/dL (ref 32.0–36.0)
MCV: 90.4 fL (ref 80.0–100.0)
MPV: 9.1 fL (ref 7.5–12.5)
PLATELETS: 318 10*3/uL (ref 140–400)
RBC: 4.05 MIL/uL (ref 3.80–5.10)
RDW: 13.4 % (ref 11.0–15.0)
WBC: 9.4 10*3/uL (ref 3.8–10.8)

## 2016-09-06 LAB — HEMOGLOBIN A1C
HEMOGLOBIN A1C: 6.2 % — AB (ref <5.7)
Mean Plasma Glucose: 131 mg/dL

## 2016-09-06 LAB — VITAMIN D 25 HYDROXY (VIT D DEFICIENCY, FRACTURES): VIT D 25 HYDROXY: 17 ng/mL — AB (ref 30–100)

## 2016-09-07 ENCOUNTER — Encounter: Payer: Self-pay | Admitting: Family Medicine

## 2016-10-03 ENCOUNTER — Ambulatory Visit (INDEPENDENT_AMBULATORY_CARE_PROVIDER_SITE_OTHER): Payer: 59 | Admitting: Family Medicine

## 2016-10-03 ENCOUNTER — Encounter: Payer: Self-pay | Admitting: Family Medicine

## 2016-10-03 VITALS — BP 134/82 | HR 68 | Temp 97.3°F | Resp 16 | Ht 63.0 in | Wt 187.0 lb

## 2016-10-03 DIAGNOSIS — K219 Gastro-esophageal reflux disease without esophagitis: Secondary | ICD-10-CM

## 2016-10-03 DIAGNOSIS — E559 Vitamin D deficiency, unspecified: Secondary | ICD-10-CM | POA: Diagnosis not present

## 2016-10-03 DIAGNOSIS — R7303 Prediabetes: Secondary | ICD-10-CM | POA: Diagnosis not present

## 2016-10-03 NOTE — Progress Notes (Signed)
Chief Complaint  Patient presents with  . Follow-up    1 month   No complaint Labs discussed Pre diabetes present Diet and exercise recommended Vitamin D deficient Low HDL and increased risk of heart disease   Patient Active Problem List   Diagnosis Date Noted  . Prediabetes 10/03/2016  . Vitamin D deficiency 10/03/2016  . Menstrual migraine without status migrainosus, not intractable 09/04/2016  . Gastroesophageal reflux disease 09/04/2016  . History of herpes simplex infection     Outpatient Encounter Prescriptions as of 10/03/2016  Medication Sig  . levonorgestrel-ethinyl estradiol (ENPRESSE,TRIVORA) tablet Take 1 tablet by mouth daily.  . valACYclovir (VALTREX) 500 MG tablet Take 500 mg by mouth 2 (two) times daily.   No facility-administered encounter medications on file as of 10/03/2016.     No Known Allergies  Review of Systems  Constitutional: Positive for fatigue. Negative for activity change, appetite change and unexpected weight change.       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   Eyes: Negative for redness and visual disturbance.  Respiratory: Negative for cough and shortness of breath.   Cardiovascular: Negative for chest pain, palpitations and leg swelling.  Gastrointestinal: Negative for abdominal pain, constipation and diarrhea.  Genitourinary: Negative for difficulty urinating, frequency and menstrual problem.  Musculoskeletal: Negative for arthralgias and back pain.  Neurological: Positive for headaches. Negative for dizziness.  Psychiatric/Behavioral: Positive for sleep disturbance. Negative for dysphoric mood. The patient is not nervous/anxious.        Never sleeps well    BP 134/82 (BP Location: Right Arm, Patient Position: Sitting, Cuff Size: Normal)   Pulse 68   Temp 97.3 F (36.3 C) (Temporal)   Resp 16   Ht 5\' 3"  (1.6 m)   Wt 187 lb (84.8 kg)   LMP 09/18/2016 (Exact  Date)   SpO2 100%   BMI 33.13 kg/m   Physical Exam  Constitutional: She is oriented to person, place, and time. She appears well-developed and well-nourished. No distress.  HENT:  Head: Normocephalic and atraumatic.  Mouth/Throat: Oropharynx is clear and moist.  Eyes: Pupils are equal, round, and reactive to light.  Cardiovascular: Normal rate, regular rhythm and normal heart sounds.   Pulmonary/Chest: Effort normal and breath sounds normal.  Neurological: She is alert and oriented to person, place, and time.  Skin: Skin is warm and dry.  Psychiatric: She has a normal mood and affect. Her behavior is normal. Thought content normal.    ASSESSMENT/PLAN:  1. Prediabetes discussed  2. Gastroesophageal reflux disease, esophagitis presence not specified On OTC  3. Vitamin D deficiency To take supplement   Patient Instructions  Try to walk every day that you are able Watch the sweets and carbohydrate intake Need blood work and a check in six months to follow the prediabetes  Call sooner for problems   Prediabetes Eating Plan Prediabetes-also called impaired glucose tolerance or impaired fasting glucose-is a condition that causes blood sugar (blood glucose) levels to be higher than normal. Following a healthy diet can help to keep prediabetes under control. It can also help to lower the risk of type 2 diabetes and heart disease, which are increased in people who have prediabetes. Along with regular exercise, a healthy diet:  Promotes weight loss.  Helps to control blood sugar levels.  Helps to improve the way that the body uses insulin. What do I need to know about this eating plan?  Use the  glycemic index (GI) to plan your meals. The index tells you how quickly a food will raise your blood sugar. Choose low-GI foods. These foods take a longer time to raise blood sugar.  Pay close attention to the amount of carbohydrates in the food that you eat. Carbohydrates increase blood  sugar levels.  Keep track of how many calories you take in. Eating the right amount of calories will help you to achieve a healthy weight. Losing about 7 percent of your starting weight can help to prevent type 2 diabetes.  You may want to follow a Mediterranean diet. This diet includes a lot of vegetables, lean meats or fish, whole grains, fruits, and healthy oils and fats. What foods can I eat? Grains  Whole grains, such as whole-wheat or whole-grain breads, crackers, cereals, and pasta. Unsweetened oatmeal. Bulgur. Barley. Quinoa. Brown rice. Corn or whole-wheat flour tortillas or taco shells. Vegetables  Lettuce. Spinach. Peas. Beets. Cauliflower. Cabbage. Broccoli. Carrots. Tomatoes. Squash. Eggplant. Herbs. Peppers. Onions. Cucumbers. Brussels sprouts. Fruits  Berries. Bananas. Apples. Oranges. Grapes. Papaya. Mango. Pomegranate. Kiwi. Grapefruit. Cherries. Meats and Other Protein Sources  Seafood. Lean meats, such as chicken and [place] or lean cuts of pork and beef. Tofu. Eggs. Nuts. Beans. Dairy  Low-fat or fat-free dairy products, such as yogurt, cottage cheese, and cheese. Beverages  Water. Tea. Coffee. Sugar-free or diet soda. Seltzer water. Milk. Milk alternatives, such as soy or almond milk. Condiments  Mustard. Relish. Low-fat, low-sugar ketchup. Low-fat, low-sugar barbecue sauce. Low-fat or fat-free mayonnaise. Sweets and Desserts  Sugar-free or low-fat pudding. Sugar-free or low-fat ice cream and other frozen treats. Fats and Oils  Avocado. Walnuts. Olive oil. The items listed above may not be a complete list of recommended foods or beverages. Contact your dietitian for more options.  What foods are not recommended? Grains  Refined white flour and flour products, such as bread, pasta, snack foods, and cereals. Beverages  Sweetened drinks, such as sweet iced tea and soda. Sweets and Desserts  Baked goods, such as cake, cupcakes, pastries, cookies, and cheesecake. The  items listed above may not be a complete list of foods and beverages to avoid. Contact your dietitian for more information.  This information is not intended to replace advice given to you by your health care provider. Make sure you discuss any questions you have with your health care provider. Document Released: 11/17/2014 Document Revised: 12/09/2015 Document Reviewed: 07/29/2014 Elsevier Interactive Patient Education  2017 Elsevier Inc.    Sarah MooreYvonne Sue Kajuana Shareef, MD

## 2016-10-03 NOTE — Patient Instructions (Addendum)
Try to walk every day that you are able Watch the sweets and carbohydrate intake Need blood work and a check in six months to follow the prediabetes  Call sooner for problems   Prediabetes Eating Plan Prediabetes-also called impaired glucose tolerance or impaired fasting glucose-is a condition that causes blood sugar (blood glucose) levels to be higher than normal. Following a healthy diet can help to keep prediabetes under control. It can also help to lower the risk of type 2 diabetes and heart disease, which are increased in people who have prediabetes. Along with regular exercise, a healthy diet:  Promotes weight loss.  Helps to control blood sugar levels.  Helps to improve the way that the body uses insulin. What do I need to know about this eating plan?  Use the glycemic index (GI) to plan your meals. The index tells you how quickly a food will raise your blood sugar. Choose low-GI foods. These foods take a longer time to raise blood sugar.  Pay close attention to the amount of carbohydrates in the food that you eat. Carbohydrates increase blood sugar levels.  Keep track of how many calories you take in. Eating the right amount of calories will help you to achieve a healthy weight. Losing about 7 percent of your starting weight can help to prevent type 2 diabetes.  You may want to follow a Mediterranean diet. This diet includes a lot of vegetables, lean meats or fish, whole grains, fruits, and healthy oils and fats. What foods can I eat? Grains  Whole grains, such as whole-wheat or whole-grain breads, crackers, cereals, and pasta. Unsweetened oatmeal. Bulgur. Barley. Quinoa. Brown rice. Corn or whole-wheat flour tortillas or taco shells. Vegetables  Lettuce. Spinach. Peas. Beets. Cauliflower. Cabbage. Broccoli. Carrots. Tomatoes. Squash. Eggplant. Herbs. Peppers. Onions. Cucumbers. Brussels sprouts. Fruits  Berries. Bananas. Apples. Oranges. Grapes. Papaya. Mango. Pomegranate.  Kiwi. Grapefruit. Cherries. Meats and Other Protein Sources  Seafood. Lean meats, such as chicken and [place] or lean cuts of pork and beef. Tofu. Eggs. Nuts. Beans. Dairy  Low-fat or fat-free dairy products, such as yogurt, cottage cheese, and cheese. Beverages  Water. Tea. Coffee. Sugar-free or diet soda. Seltzer water. Milk. Milk alternatives, such as soy or almond milk. Condiments  Mustard. Relish. Low-fat, low-sugar ketchup. Low-fat, low-sugar barbecue sauce. Low-fat or fat-free mayonnaise. Sweets and Desserts  Sugar-free or low-fat pudding. Sugar-free or low-fat ice cream and other frozen treats. Fats and Oils  Avocado. Walnuts. Olive oil. The items listed above may not be a complete list of recommended foods or beverages. Contact your dietitian for more options.  What foods are not recommended? Grains  Refined white flour and flour products, such as bread, pasta, snack foods, and cereals. Beverages  Sweetened drinks, such as sweet iced tea and soda. Sweets and Desserts  Baked goods, such as cake, cupcakes, pastries, cookies, and cheesecake. The items listed above may not be a complete list of foods and beverages to avoid. Contact your dietitian for more information.  This information is not intended to replace advice given to you by your health care provider. Make sure you discuss any questions you have with your health care provider. Document Released: 11/17/2014 Document Revised: 12/09/2015 Document Reviewed: 07/29/2014 Elsevier Interactive Patient Education  2017 [employer]

## 2016-12-26 DIAGNOSIS — Z6834 Body mass index (BMI) 34.0-34.9, adult: Secondary | ICD-10-CM | POA: Diagnosis not present

## 2016-12-26 DIAGNOSIS — Z3040 Encounter for surveillance of contraceptives, unspecified: Secondary | ICD-10-CM | POA: Diagnosis not present

## 2016-12-26 DIAGNOSIS — Z01419 Encounter for gynecological examination (general) (routine) without abnormal findings: Secondary | ICD-10-CM | POA: Diagnosis not present

## 2016-12-26 DIAGNOSIS — Z124 Encounter for screening for malignant neoplasm of cervix: Secondary | ICD-10-CM | POA: Diagnosis not present

## 2016-12-26 DIAGNOSIS — G43009 Migraine without aura, not intractable, without status migrainosus: Secondary | ICD-10-CM | POA: Diagnosis not present

## 2016-12-26 DIAGNOSIS — B009 Herpesviral infection, unspecified: Secondary | ICD-10-CM | POA: Diagnosis not present

## 2017-02-13 ENCOUNTER — Telehealth: Payer: Self-pay | Admitting: *Deleted

## 2017-02-13 NOTE — Telephone Encounter (Signed)
Called patient regarding message below. No answer, left generic message for patient to return call.   

## 2017-02-13 NOTE — Telephone Encounter (Signed)
Patient called stating she has a sinus infection, per patient her eyes and cheeks are swollen and they just came back from vacation. Patient is requesting if something can be called in. Please advise

## 2017-02-13 NOTE — Telephone Encounter (Signed)
NO.  Warm compresses and fluids should do the trick

## 2017-02-13 NOTE — Telephone Encounter (Signed)
Patient informed of message below, verbalized understanding. She asks what to do if her eye swells again. She states it got real puffy, and itchy but there was a bump on the inside of her eyelid that had a green discharge. She way away so she just used warm compresses and it is gone now but wants to know if there is something different she needs to do if it comes back

## 2017-02-13 NOTE — Telephone Encounter (Signed)
No.  Most sinus infections are viruses and can be treated with OTC mucinex DM, flonase and fluids.  If has fever or severe symptoms needs to be seen

## 2017-03-02 ENCOUNTER — Other Ambulatory Visit: Payer: Self-pay | Admitting: Obstetrics and Gynecology

## 2017-03-02 DIAGNOSIS — Z1231 Encounter for screening mammogram for malignant neoplasm of breast: Secondary | ICD-10-CM

## 2017-03-05 DIAGNOSIS — H52221 Regular astigmatism, right eye: Secondary | ICD-10-CM | POA: Diagnosis not present

## 2017-03-05 DIAGNOSIS — H52222 Regular astigmatism, left eye: Secondary | ICD-10-CM | POA: Diagnosis not present

## 2017-03-05 DIAGNOSIS — H5201 Hypermetropia, right eye: Secondary | ICD-10-CM | POA: Diagnosis not present

## 2017-03-05 DIAGNOSIS — H524 Presbyopia: Secondary | ICD-10-CM | POA: Diagnosis not present

## 2017-04-04 ENCOUNTER — Ambulatory Visit
Admission: RE | Admit: 2017-04-04 | Discharge: 2017-04-04 | Disposition: A | Discharge status: Home / Self Care | Payer: 59 | Source: Ambulatory Visit | Attending: Obstetrics and Gynecology | Admitting: Obstetrics and Gynecology

## 2017-04-04 DIAGNOSIS — Z1231 Encounter for screening mammogram for malignant neoplasm of breast: Secondary | ICD-10-CM | POA: Diagnosis not present

## 2017-04-09 ENCOUNTER — Ambulatory Visit: Payer: 59 | Admitting: Family Medicine

## 2017-05-24 ENCOUNTER — Telehealth: Payer: Self-pay | Admitting: *Deleted

## 2017-05-24 DIAGNOSIS — R7303 Prediabetes: Secondary | ICD-10-CM

## 2017-05-24 NOTE — Telephone Encounter (Signed)
Labs ordered, Aurore aware.

## 2017-05-24 NOTE — Telephone Encounter (Signed)
Patient called and scheduled an appointment for next Wednesday for her 6 month follow up and patient states she is having blood pressure issues. Patient states she needs blood work done and I seen on Dr Morgan Stanleyelson's last note she wants her to do labs. Please order labs and call patient with which labs were ordered and if they are fasting or non fasting. Please advise

## 2017-05-25 DIAGNOSIS — R7303 Prediabetes: Secondary | ICD-10-CM | POA: Diagnosis not present

## 2017-05-26 LAB — CBC
HCT: 36.3 % (ref 35.0–45.0)
Hemoglobin: 11.9 g/dL (ref 11.7–15.5)
MCH: 29.3 pg (ref 27.0–33.0)
MCHC: 32.8 g/dL (ref 32.0–36.0)
MCV: 89.4 fL (ref 80.0–100.0)
MPV: 9.9 fL (ref 7.5–12.5)
PLATELETS: 363 10*3/uL (ref 140–400)
RBC: 4.06 10*6/uL (ref 3.80–5.10)
RDW: 12 % (ref 11.0–15.0)
WBC: 8.2 10*3/uL (ref 3.8–10.8)

## 2017-05-26 LAB — COMPLETE METABOLIC PANEL WITH GFR
AG Ratio: 1.5 (calc) (ref 1.0–2.5)
ALBUMIN MSPROF: 4.2 g/dL (ref 3.6–5.1)
ALT: 27 U/L (ref 6–29)
AST: 21 U/L (ref 10–35)
Alkaline phosphatase (APISO): 52 U/L (ref 33–115)
BUN: 7 mg/dL (ref 7–25)
CO2: 25 mmol/L (ref 20–32)
CREATININE: 0.61 mg/dL (ref 0.50–1.10)
Calcium: 9.3 mg/dL (ref 8.6–10.2)
Chloride: 101 mmol/L (ref 98–110)
GFR, EST NON AFRICAN AMERICAN: 109 mL/min/{1.73_m2} (ref >=60)
GFR, Est African American: 126 mL/min/{1.73_m2} (ref >=60)
GLOBULIN: 2.8 g/dL (ref 1.9–3.7)
GLUCOSE: 128 mg/dL — AB (ref 65–99)
Potassium: 4.1 mmol/L (ref 3.5–5.3)
SODIUM: 135 mmol/L (ref 135–146)
Total Bilirubin: 2.9 mg/dL — ABNORMAL HIGH (ref 0.2–1.2)
Total Protein: 7 g/dL (ref 6.1–8.1)

## 2017-05-26 LAB — URINALYSIS, ROUTINE W REFLEX MICROSCOPIC
BACTERIA UA: NONE SEEN /HPF
Glucose, UA: NEGATIVE
HGB URINE DIPSTICK: NEGATIVE
HYALINE CAST: NONE SEEN /LPF
Ketones, ur: NEGATIVE
Nitrite: NEGATIVE
Protein, ur: NEGATIVE
SPECIFIC GRAVITY, URINE: 1.02 (ref 1.001–1.03)

## 2017-05-26 LAB — LIPID PANEL
CHOL/HDL RATIO: 5.4 (calc) — AB (ref <5.0)
CHOLESTEROL: 124 mg/dL (ref <200)
HDL: 23 mg/dL — ABNORMAL LOW (ref >50)
LDL Cholesterol (Calc): 72 mg/dL (calc)
NON-HDL CHOLESTEROL (CALC): 101 mg/dL (ref <130)
TRIGLYCERIDES: 202 mg/dL — AB (ref <150)

## 2017-05-26 LAB — HEMOGLOBIN A1C
Hgb A1c MFr Bld: 6.6 % of total Hgb — ABNORMAL HIGH (ref <5.7)
MEAN PLASMA GLUCOSE: 143 (calc)
eAG (mmol/L): 7.9 (calc)

## 2017-05-30 ENCOUNTER — Other Ambulatory Visit: Payer: Self-pay

## 2017-05-30 ENCOUNTER — Encounter: Payer: Self-pay | Admitting: Family Medicine

## 2017-05-30 ENCOUNTER — Ambulatory Visit (INDEPENDENT_AMBULATORY_CARE_PROVIDER_SITE_OTHER): Payer: 59 | Admitting: Family Medicine

## 2017-05-30 VITALS — BP 158/82 | HR 80 | Temp 98.1°F | Resp 16 | Ht 63.0 in | Wt 191.1 lb

## 2017-05-30 DIAGNOSIS — Z6833 Body mass index (BMI) 33.0-33.9, adult: Secondary | ICD-10-CM | POA: Diagnosis not present

## 2017-05-30 DIAGNOSIS — R030 Elevated blood-pressure reading, without diagnosis of hypertension: Secondary | ICD-10-CM | POA: Diagnosis not present

## 2017-05-30 DIAGNOSIS — Z23 Encounter for immunization: Secondary | ICD-10-CM

## 2017-05-30 DIAGNOSIS — E661 Drug-induced obesity: Secondary | ICD-10-CM | POA: Diagnosis not present

## 2017-05-30 DIAGNOSIS — R7303 Prediabetes: Secondary | ICD-10-CM | POA: Diagnosis not present

## 2017-05-30 NOTE — Patient Instructions (Signed)
Try to get on the treadmill every day Stop the sweet drinks Reduce the carbohydrates  See me in 1-2 months Call sooner for problems

## 2017-05-30 NOTE — Progress Notes (Signed)
Chief Complaint  Patient presents with  . Follow-up    6 months   Patient here for follow-up.  She had recent lab work.  Her hemoglobin A1c has gone from 6.2-6.6.  Her weight is increased.  And her blood pressure is up.  I discussed with her that she seriously needs to rethink her lifestyle.  I told her that she could avoid going on diabetes and blood pressure medication, and likely lipid medication if she would diet and exercise.  She has been to a diabetes educator.  She knows what she should eat.  She has a treadmill in her home.  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  We had a discussion that nobody will benefit if her health fails.  She still has menstrual migraines.  She is on oral contraceptives prescribed by her gynecologist.  She is non-smoker. Her lab tests were otherwise normal or as expected.  Patient Active Problem List   Diagnosis Date Noted  . Prediabetes 10/03/2016  . Vitamin D deficiency 10/03/2016  . Menstrual migraine without status migrainosus, not intractable 09/04/2016  . Gastroesophageal reflux disease 09/04/2016  . Obesity 10/14/2012  . History of herpes simplex infection     Outpatient Encounter Medications as of 05/30/2017  Medication Sig  . levonorgestrel-ethinyl estradiol (ENPRESSE,TRIVORA) tablet Take 1 tablet by mouth daily.  . valACYclovir (VALTREX) 500 MG tablet Take 500 mg by mouth 2 (two) times daily.   No facility-administered encounter medications on file as of 05/30/2017.     No Known Allergies  Review of Systems  Constitutional: Positive for fatigue. Negative for activity change, appetite change and unexpected weight change.       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   Eyes: Negative for redness and visual disturbance.  Respiratory: Negative for cough and shortness of breath.   Cardiovascular: Negative for chest pain,  palpitations and leg swelling.  Gastrointestinal: Negative for abdominal pain, constipation and diarrhea.  Genitourinary: Negative for difficulty urinating, frequency and menstrual problem.  Musculoskeletal: Negative for arthralgias and back pain.  Neurological: Positive for headaches. Negative for dizziness.  Psychiatric/Behavioral: Positive for sleep disturbance. Negative for dysphoric mood. The patient is not nervous/anxious.        Never sleeps well, awakens through the night    BP (!) 158/82 (BP Location: Right Arm, Patient Position: Sitting, Cuff Size: Normal)   Pulse 80   Temp 98.1 F (36.7 C) (Temporal)   Resp 16   Ht 5\' 3"  (1.6 m)   Wt 191 lb 1.3 oz (86.7 kg)   LMP 05/29/2017 (Exact Date)   SpO2 100%   BMI 33.85 kg/m   Physical Exam  Constitutional: She is oriented to person, place, and time. She appears well-developed and well-nourished. No distress.  HENT:  Head: Normocephalic and atraumatic.  Mouth/Throat: Oropharynx is clear and moist.  Eyes: Conjunctivae are normal. Pupils are equal, round, and reactive to light.  Neck: Normal range of motion. No thyromegaly present.  Cardiovascular: Normal rate, regular rhythm and normal heart sounds.  No murmur heard. Pulmonary/Chest: Effort normal and breath sounds normal. She has no rales.  Lymphadenopathy:    She has no cervical adenopathy.  Neurological: She is alert and oriented to person, place, and time.  Skin: Skin is warm and dry.  Psychiatric: She has a normal mood and affect. Her behavior is normal. Thought content  normal.    ASSESSMENT/PLAN:  1. Need for influenza vaccination - Flu Vaccine QUAD 36+ mos IM  2. Prediabetes Discussed the importance of lifestyle, diet, exercise.  Offered referral back to diabetes educator, declined - CBC - COMPLETE METABOLIC PANEL WITH GFR - Hemoglobin A1c - Lipid panel - Urinalysis, Routine w reflex microscopic  3. Elevated BP without diagnosis of hypertension Patient will  keep track of her blood pressure at home and bring me back a log  4. 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

## 2017-06-11 NOTE — Telephone Encounter (Signed)
-----   Message from Jerilynn Somhelsey R Sims, [place] sent at 06/11/2017  2:44 PM EST ----- Required Items This encounter contains procedures and/or medications that do not have an associated diagnosis.  Please associate these orders before closing this encounter.  Another one to close

## 2017-07-02 NOTE — Telephone Encounter (Signed)
Can you close this encounter?

## 2017-07-05 ENCOUNTER — Emergency Department (HOSPITAL_COMMUNITY)
Admission: EM | Admit: 2017-07-05 | Discharge: 2017-07-05 | Disposition: A | Discharge status: Home / Self Care | Payer: 59 | Attending: Emergency Medicine | Admitting: Emergency Medicine

## 2017-07-05 ENCOUNTER — Telehealth: Payer: Self-pay | Admitting: Family Medicine

## 2017-07-05 ENCOUNTER — Emergency Department (HOSPITAL_COMMUNITY): Payer: 59

## 2017-07-05 ENCOUNTER — Other Ambulatory Visit: Payer: Self-pay

## 2017-07-05 DIAGNOSIS — N132 Hydronephrosis with renal and ureteral calculous obstruction: Secondary | ICD-10-CM | POA: Diagnosis not present

## 2017-07-05 DIAGNOSIS — R109 Unspecified abdominal pain: Secondary | ICD-10-CM | POA: Diagnosis not present

## 2017-07-05 DIAGNOSIS — Z79899 Other long term (current) drug therapy: Secondary | ICD-10-CM | POA: Insufficient documentation

## 2017-07-05 DIAGNOSIS — N201 Calculus of ureter: Secondary | ICD-10-CM | POA: Insufficient documentation

## 2017-07-05 DIAGNOSIS — N23 Unspecified renal colic: Secondary | ICD-10-CM | POA: Insufficient documentation

## 2017-07-05 DIAGNOSIS — R1011 Right upper quadrant pain: Secondary | ICD-10-CM | POA: Diagnosis present

## 2017-07-05 LAB — URINALYSIS, ROUTINE W REFLEX MICROSCOPIC
BACTERIA UA: NONE SEEN
BILIRUBIN URINE: NEGATIVE
Glucose, UA: NEGATIVE mg/dL
Ketones, ur: NEGATIVE mg/dL
Leukocytes, UA: NEGATIVE
Nitrite: NEGATIVE
PROTEIN: 30 mg/dL — AB
SPECIFIC GRAVITY, URINE: 1.02 (ref 1.005–1.030)
pH: 5 (ref 5.0–8.0)

## 2017-07-05 LAB — PREGNANCY, URINE: Preg Test, Ur: NEGATIVE

## 2017-07-05 MED ORDER — MORPHINE SULFATE (PF) 4 MG/ML IV SOLN: 4.0000 mg | INTRAVENOUS | Status: DC | PRN

## 2017-07-05 MED ORDER — NAPROXEN 250 MG PO TABS: 250.0000 mg | ORAL_TABLET | Freq: Two times a day (BID) | ORAL | 0 refills | Status: DC

## 2017-07-05 MED ORDER — MORPHINE SULFATE (PF) 2 MG/ML IV SOLN
INTRAVENOUS | Status: AC
  Administered 2017-07-05: 4 mg via INTRAVENOUS
  Filled 2017-07-05: qty 2

## 2017-07-05 MED ORDER — ONDANSETRON HCL 4 MG/2ML IJ SOLN
4.0000 mg | INTRAMUSCULAR | Status: DC | PRN
  Administered 2017-07-05: 4 mg via INTRAVENOUS
  Filled 2017-07-05: qty 2

## 2017-07-05 MED ORDER — TAMSULOSIN HCL 0.4 MG PO CAPS: 0.4000 mg | ORAL_CAPSULE | Freq: Every day | ORAL | 0 refills | Status: DC

## 2017-07-05 MED ORDER — ONDANSETRON 4 MG PO TBDP: 4.0000 mg | ORAL_TABLET | Freq: Three times a day (TID) | ORAL | 0 refills | Status: DC | PRN

## 2017-07-05 MED ORDER — OXYCODONE-ACETAMINOPHEN 5-325 MG PO TABS
2.0000 | ORAL_TABLET | Freq: Once | ORAL | Status: AC
  Administered 2017-07-05: 2 via ORAL
  Filled 2017-07-05: qty 2

## 2017-07-05 MED ORDER — OXYCODONE-ACETAMINOPHEN 5-325 MG PO TABS: ORAL_TABLET | ORAL | 0 refills | Status: DC

## 2017-07-05 MED ORDER — KETOROLAC TROMETHAMINE 30 MG/ML IJ SOLN
30.0000 mg | Freq: Once | INTRAMUSCULAR | Status: AC
  Administered 2017-07-05: 30 mg via INTRAVENOUS
  Filled 2017-07-05: qty 1

## 2017-07-05 NOTE — ED Provider Notes (Signed)
[facility] EMERGENCY DEPARTMENT Provider Note   CSN: [id number] Arrival date & time: 07/05/17  0645     History   Chief Complaint Chief Complaint  Patient presents with  . Abdominal Pain    HPI Sarah Roman is a 46 y.o. female.  HPI  Pt was seen at 0715. Per pt, c/o sudden onset and persistence of constant right sided flank "pain" that began overnight last night.  Pt describes the pain as "sharp," and radiating into the right side of her abd.  Has been associated with multiple intermittent episodes of N/V.  Denies dysuria/hematuria, no abd pain, no diarrhea, no black or blood in emesis, no CP/SOB, no fevers, no rash, no vaginal bleeding/discharge.    Past Medical History:  Diagnosis Date  . Abnormal Pap smear   . GERD (gastroesophageal reflux disease)   . History of herpes simplex infection     Patient Active Problem List   Diagnosis Date Noted  . Prediabetes 10/03/2016  . Vitamin D deficiency 10/03/2016  . Menstrual migraine without status migrainosus, not intractable 09/04/2016  . Gastroesophageal reflux disease 09/04/2016  . Obesity 10/14/2012  . History of herpes simplex infection     Past Surgical History:  Procedure Laterality Date  . CHOLECYSTECTOMY  11/24/1991  . LEEP      OB History    Gravida Para Term Preterm AB Living   3 3       3    SAB TAB Ectopic Multiple Live Births                   Home Medications    Prior to Admission medications   Medication Sig Start Date End Date Taking? Authorizing Provider  levonorgestrel-ethinyl estradiol (ENPRESSE,TRIVORA) tablet Take 1 tablet by mouth daily. 09/27/12   Jaymes Graffillard, Naima, MD  valACYclovir (VALTREX) 500 MG tablet Take 500 mg by mouth 2 (two) times daily.    [provider]    Family History Family History  Problem Relation Age of Onset  . Stroke Maternal Grandmother   . Diabetes Maternal Grandmother   . Kidney disease Mother        dialysis, died at 6869  . Diabetes Mother     . Heart disease Mother   . Hyperlipidemia Mother   . Hypertension Mother   . Cancer Father        prostate  . Heart disease Father        died at 4376  . Diabetes Father   . Hypertension Father   . Hyperlipidemia Father   . Early death Maternal Grandfather        PE after surgery  . Diabetes Paternal Grandmother   . Cancer Paternal Grandfather        prostate  . Early death Other 1011       choking    Social History Social History   Tobacco Use  . Smoking status: Never Smoker  . Smokeless tobacco: Never Used  Substance Use Topics  . Alcohol use: Yes  . Drug use: No     Allergies   Patient has no known allergies.   Review of Systems Review of Systems ROS: Statement: All systems negative except as marked or noted in the HPI; Constitutional: Negative for fever and chills. ; ; Eyes: Negative for eye pain, redness and discharge. ; ; ENMT: Negative for ear pain, hoarseness, nasal congestion, sinus pressure and sore throat. ; ; Cardiovascular: Negative for chest pain, palpitations, diaphoresis, dyspnea and peripheral  edema. ; ; Respiratory: Negative for cough, wheezing and stridor. ; ; Gastrointestinal: +N/V. Negative for diarrhea, abdominal pain, blood in stool, hematemesis, jaundice and rectal bleeding. . ; ; Genitourinary: +flank pain. Negative for dysuria and hematuria. ; ; Musculoskeletal: Negative for back pain and neck pain. Negative for swelling and trauma.; ; GYN:  No pelvic pain, no vaginal bleeding, no vaginal discharge, no vulvar pain. ;; Skin: Negative for pruritus, rash, abrasions, blisters, bruising and skin lesion.; ; Neuro: Negative for headache, lightheadedness and neck stiffness. Negative for weakness, altered level of consciousness, altered mental status, extremity weakness, paresthesias, involuntary movement, seizure and syncope.      Physical Exam Updated Vital Signs BP (!) 126/57 (BP Location: Right Arm)   Pulse 75   Temp 98.1 F (36.7 C) (Oral)   Resp 17    Ht 5\' 2"  (1.575 m)   Wt 83 kg (183 lb)   LMP 06/25/2017   SpO2 100%   BMI 33.47 kg/m     Physical Exam 0720: Physical examination:  Nursing notes reviewed; Vital signs and O2 SAT reviewed;  Constitutional: Well developed, Well nourished, Well hydrated, Uncomfortable appearing; Head:  Normocephalic, atraumatic; Eyes: EOMI, PERRL, No scleral icterus; ENMT: Mouth and pharynx normal, Mucous membranes moist; Neck: Supple, Full range of motion, No lymphadenopathy; Cardiovascular: Regular rate and rhythm, No gallop; Respiratory: Breath sounds clear & equal bilaterally, No wheezes.  Speaking full sentences with ease, Normal respiratory effort/excursion; Chest: Nontender, Movement normal; Abdomen: Soft, Nontender, Nondistended, Normal bowel sounds; Genitourinary: No CVA tenderness; Spine:  No midline CS, TS, LS tenderness. +TTP right lumbar paraspinal muscles. No rash.;; Extremities: Pulses normal, No tenderness, No edema, No calf edema or asymmetry.; Neuro: AA&Ox3, Major CN grossly intact.  Speech clear. No gross focal motor or sensory deficits in extremities.; Skin: Color normal, Warm, Dry.   ED Treatments / Results  Labs (all labs ordered are listed, but only abnormal results are displayed)   EKG  EKG Interpretation None       Radiology   Procedures Procedures (including critical care time)  Medications Ordered in ED Medications  morphine 4 MG/ML injection 4 mg (not administered)  ondansetron (ZOFRAN) injection 4 mg (4 mg Intravenous Given 07/05/17 0727)  morphine 2 MG/ML injection (4 mg Intravenous Given 07/05/17 0728)  morphine 2 MG/ML injection (4 mg Intravenous Given 07/05/17 0806)  ketorolac (TORADOL) 30 MG/ML injection 30 mg (30 mg Intravenous Given 07/05/17 0837)     Initial Impression / Assessment and Plan / ED Course  I have reviewed the triage vital signs and the nursing notes.  Pertinent labs & imaging results that were available during my care of the patient were  reviewed by me and considered in my medical decision making (see chart for details).  MDM Reviewed: previous chart, nursing note and vitals Reviewed previous: labs Interpretation: labs and CT scan   Results for orders placed or performed during the hospital encounter of 07/05/17  Pregnancy, urine  Result Value Ref Range   Preg Test, Ur NEGATIVE NEGATIVE  Urinalysis, Routine w reflex microscopic  Result Value Ref Range   Color, Urine AMBER (A) YELLOW   APPearance CLEAR CLEAR   Specific Gravity, Urine 1.020 1.005 - 1.030   pH 5.0 5.0 - 8.0   Glucose, UA NEGATIVE NEGATIVE mg/dL   Hgb urine dipstick SMALL (A) NEGATIVE   Bilirubin Urine NEGATIVE NEGATIVE   Ketones, ur NEGATIVE NEGATIVE mg/dL   Protein, ur 30 (A) NEGATIVE mg/dL   Nitrite NEGATIVE NEGATIVE  Leukocytes, UA NEGATIVE NEGATIVE   RBC / HPF 0-5 0 - 5 RBC/hpf   WBC, UA 0-5 0 - 5 WBC/hpf   Bacteria, UA NONE SEEN NONE SEEN   Squamous Epithelial / LPF 0-5 (A) NONE SEEN   Mucus PRESENT    Ct Renal Stone Study Result Date: 07/05/2017 CLINICAL DATA:  Acute right flank pain EXAM: CT ABDOMEN AND PELVIS WITHOUT CONTRAST TECHNIQUE: Multidetector CT imaging of the abdomen and pelvis was performed following the standard protocol without IV contrast. COMPARISON:  None. FINDINGS: Lower chest:  No contributory findings. Hepatobiliary: No focal liver abnormality.Hepatic steatosis. Cholecystectomy. Normal common bile duct diameter. Pancreas: Unremarkable. Spleen: Unremarkable. Adrenals/Urinary Tract: Negative adrenals. Right renal expansion, perinephric edema, and hydroureteronephrosis secondary to a 2 mm stone at the right UVJ. No additional urolithiasis. The bladder is decompressed. Stomach/Bowel:  No obstruction. No appendicitis. Vascular/Lymphatic: No acute vascular abnormality. No mass or adenopathy. Reproductive:No pathologic findings.  Presumed follicle on the right Other: No ascites or pneumoperitoneum.  Fatty umbilical hernia.  Musculoskeletal: No acute abnormalities. IMPRESSION: 1. Obstructing 2 mm stone at the right UVJ. 2. Hepatic steatosis. Electronically Signed   By: Marnee Spring M.D.   On: 07/05/2017 07:50    1030:  Pt has ambulated with steady gait, easy resps, NAD. Pt has tol PO well while in the ED without N/V.  No stooling while in the ED.  Abd remains benign, VSS. Feels better and wants to go home now. Tx symptomatically, f/u Uro MD. Dx and testing d/w pt and family.  Questions answered.  Verb understanding, agreeable to d/c home with outpt f/u.    Final Clinical Impressions(s) / ED Diagnoses   Final diagnoses:  None    ED Discharge Orders    None       Samuel Jester, DO 07/08/17 1610

## 2017-07-05 NOTE — Telephone Encounter (Signed)
Called at 5:45 am c/o severe back pain radiating around to abdomen, had not had pain like this before States she has had her gallbladder removed and no h/o kidney stones. I  Spoke with her husband and advised that she go to the ED for evaluation. He expressed thanks and stated he would be doing that

## 2017-07-05 NOTE — ED Triage Notes (Signed)
Pt states that she was awoken by a pain in her back that goes to her abd. She has been vomiting also

## 2017-07-05 NOTE — Discharge Instructions (Signed)
Take the prescriptions as directed.  Call the Urologist today to schedule a follow up appointment next week.  Return to the Emergency Department immediately if worsening. ° °

## 2017-07-24 ENCOUNTER — Other Ambulatory Visit: Payer: Self-pay

## 2017-07-24 ENCOUNTER — Encounter: Payer: Self-pay | Admitting: Family Medicine

## 2017-07-24 ENCOUNTER — Ambulatory Visit: Payer: 59 | Admitting: Family Medicine

## 2017-07-24 VITALS — BP 144/78 | HR 68 | Temp 97.8°F | Resp 18 | Ht 62.0 in | Wt 177.1 lb

## 2017-07-24 DIAGNOSIS — N200 Calculus of kidney: Secondary | ICD-10-CM

## 2017-07-24 DIAGNOSIS — R030 Elevated blood-pressure reading, without diagnosis of hypertension: Secondary | ICD-10-CM | POA: Diagnosis not present

## 2017-07-24 HISTORY — DX: Calculus of kidney: N20.0

## 2017-07-24 NOTE — Patient Instructions (Signed)
Congratulations on losing weight Try to incorporate more exercise into your day See me in 3 months Check BP periodically, let me know result Drink water!  Avoid dehydration.

## 2017-07-24 NOTE — Progress Notes (Signed)
Chief Complaint  Patient presents with  . Follow-up    2 months  Patient is here for 73-month follow-up.  When she was seen last time she had elevated blood pressure.  We decided to try lifestyle changes, diet, exercise, and weight reduction to see if she could avoid going on blood pressure medication.  She has lost 10 pounds.  She is compliant with her diet.  She is not exercising.  We discussed that this would be beneficial for her.  Her blood pressure is lower but not quite at goal.  She is going to try to lose 10 more pounds and see me back in a couple of months. Since I saw her last she did have a kidney stone.  She was seen in the emergency room.  She was having terrible pain, and a CAT scan was performed which showed an stone at the UVJ on the right.  She thinks she passed the stone.  She wants to know what she can do to prevent future stones.  Without knowing the composition of the stone I cannot give her specific dietary advice but I did impress upon her to remain well-hydrated   Patient Active Problem List   Diagnosis Date Noted  . Kidney stone on right side 07/24/2017  . Prediabetes 10/03/2016  . Vitamin D deficiency 10/03/2016  . Menstrual migraine without status migrainosus, not intractable 09/04/2016  . Gastroesophageal reflux disease 09/04/2016  . Obesity 10/14/2012  . History of herpes simplex infection     Outpatient Encounter Medications as of 07/24/2017  Medication Sig  . levonorgestrel-ethinyl estradiol (ENPRESSE,TRIVORA) tablet Take 1 tablet by mouth daily.  . naproxen (NAPROSYN) 250 MG tablet Take 1 tablet (250 mg total) by mouth 2 (two) times daily with a meal.  . valACYclovir (VALTREX) 500 MG tablet Take 500 mg by mouth 2 (two) times daily.   No facility-administered encounter medications on file as of 07/24/2017.     No Known Allergies  Review of Systems  Constitutional: Negative for activity change, appetite change and unexpected weight change.       Planned  weight loss  HENT: Negative for congestion, dental problem, postnasal drip and rhinorrhea.   Eyes: Negative for redness and visual disturbance.  Respiratory: Negative for cough and shortness of breath.   Cardiovascular: Negative for chest pain, palpitations and leg swelling.  Gastrointestinal: Negative for abdominal pain, constipation and diarrhea.  Genitourinary: Negative for difficulty urinating, frequency and menstrual problem.  Musculoskeletal: Negative for arthralgias and back pain.  Neurological: Negative for dizziness and headaches.  Psychiatric/Behavioral: Negative for dysphoric mood and sleep disturbance. The patient is not nervous/anxious.     BP (!) 144/78   Pulse 68   Temp 97.8 F (36.6 C) (Temporal)   Resp 18   Ht 5\' 2"  (1.575 m)   Wt 177 lb 1.3 oz (80.3 kg)   LMP 06/25/2017   SpO2 100%   BMI 32.39 kg/m   Physical Exam  Constitutional: She is oriented to person, place, and time. She appears well-developed and well-nourished. No distress.  HENT:  Head: Normocephalic and atraumatic.  Mouth/Throat: Oropharynx is clear and moist.  Eyes: Conjunctivae are normal. Pupils are equal, round, and reactive to light.  Neck: Normal range of motion. No thyromegaly present.  Cardiovascular: Normal rate, regular rhythm and normal heart sounds.  No murmur heard. Pulmonary/Chest: Effort normal and breath sounds normal. She has no rales.  Abdominal:  No CVA tenderness  Lymphadenopathy:    She has  no cervical adenopathy.  Neurological: She is alert and oriented to person, place, and time.  Skin: Skin is warm and dry.  Psychiatric: She has a normal mood and affect. Her behavior is normal. Thought content normal.    ASSESSMENT/PLAN:  1. Kidney stone on right side Resolved  2. Elevated blood-pressure reading without diagnosis of hypertension Improving.   Patient Instructions  Congratulations on losing weight Try to incorporate more exercise into your day See me in 3  months Check BP periodically, let me know result Drink water!  Avoid dehydration.   Eustace MooreYvonne Sue Jadah Bobak, MD

## 2017-09-04 ENCOUNTER — Telehealth: Payer: Self-pay

## 2017-09-04 NOTE — Telephone Encounter (Signed)
-----   Message from Jerilynn Somhelsey R Sims, [place] sent at 08/30/2017  9:26 AM EST ----- Can you close this encounter

## 2017-09-06 NOTE — Telephone Encounter (Signed)
I am not able to close this either, you may need to ask for it help.

## 2017-09-06 NOTE — Telephone Encounter (Signed)
To Close This Visit   Required Items  This encounter contains procedures and/or medications that do not have an associated diagnosis.  Please associate these orders before closing this encounter.        Can this be closed? It is in my encounters with the office I am currently at.

## 2017-10-22 ENCOUNTER — Ambulatory Visit (INDEPENDENT_AMBULATORY_CARE_PROVIDER_SITE_OTHER): Payer: 59 | Admitting: Family Medicine

## 2017-10-22 ENCOUNTER — Encounter: Payer: Self-pay | Admitting: Family Medicine

## 2017-10-22 VITALS — BP 124/80 | HR 66 | Resp 16 | Ht 62.0 in | Wt 178.0 lb

## 2017-10-22 DIAGNOSIS — R030 Elevated blood-pressure reading, without diagnosis of hypertension: Secondary | ICD-10-CM | POA: Diagnosis not present

## 2017-10-22 DIAGNOSIS — R7303 Prediabetes: Secondary | ICD-10-CM | POA: Diagnosis not present

## 2017-10-22 NOTE — Patient Instructions (Signed)
You will see Dr Tracie HarrierHagler next visit Come in the fall for a physical and labs  Call sooner for problems

## 2017-10-22 NOTE — Progress Notes (Signed)
Chief Complaint  Patient presents with  . Hypertension    follow up visit    Sarah Roman is here for follow-up. 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  She thinks this is helping her blood pressure.  Her blood pressure today is normal.  Her weight is stable.  She has reduce the salt in her diet.  She is trying to exercise, when she has time, but she still has 5 children living in the home and works full-time. She still has an occasional menstrual migraine. She is not taking her vitamin D, and is reminded that her vitamin D was deficient at last lab review.   Patient Active Problem List   Diagnosis Date Noted  . Kidney stone on right side 07/24/2017  . Prediabetes 10/03/2016  . Vitamin D deficiency 10/03/2016  . Menstrual migraine without status migrainosus, not intractable 09/04/2016  . Gastroesophageal reflux disease 09/04/2016  . Obesity 10/14/2012  . History of herpes simplex infection     Outpatient Encounter Medications as of 10/22/2017  Medication Sig  . levonorgestrel-ethinyl estradiol (ENPRESSE,TRIVORA) tablet Take 1 tablet by mouth daily.  . valACYclovir (VALTREX) 500 MG tablet Take 500 mg by mouth 2 (two) times daily.  . [DISCONTINUED] naproxen (NAPROSYN) 250 MG tablet Take 1 tablet (250 mg total) by mouth 2 (two) times daily with a meal.   No facility-administered encounter medications on file as of 10/22/2017.     No Known Allergies  Review of Systems  Constitutional: Negative for activity change, appetite change and unexpected weight change.  HENT: Negative for congestion, dental problem, postnasal drip and rhinorrhea.   Eyes: Negative for redness and visual disturbance.  Respiratory: Negative for cough and shortness of breath.   Cardiovascular: Negative for chest pain, palpitations and leg swelling.  Gastrointestinal: Negative for abdominal pain, constipation and diarrhea.  Genitourinary: Negative for  difficulty urinating, frequency and menstrual problem.  Musculoskeletal: Negative for arthralgias and back pain.  Neurological: Negative for dizziness and headaches.  Psychiatric/Behavioral: Negative for dysphoric mood and sleep disturbance. The patient is not nervous/anxious.     Physical Exam  Constitutional: She is oriented to person, place, and time. She appears well-developed and well-nourished. No distress.  HENT:  Head: Normocephalic and atraumatic.  Mouth/Throat: Oropharynx is clear and moist.  Eyes: Pupils are equal, round, and reactive to light. Conjunctivae are normal.  Neck: Normal range of motion. No thyromegaly present.  Cardiovascular: Normal rate, regular rhythm and normal heart sounds.  No murmur heard. Pulmonary/Chest: Effort normal and breath sounds normal. She has no rales.  Musculoskeletal: She exhibits no edema.  Lymphadenopathy:    She has no cervical adenopathy.  Neurological: She is alert and oriented to person, place, and time.  Skin: Skin is warm and dry.  Psychiatric: She has a normal mood and affect. Her behavior is normal. Thought content normal.    BP 124/80   Pulse 66   Resp 16   Ht 5\' 2"  (1.575 m)   Wt 178 lb (80.7 kg)   SpO2 98%   BMI 32.56 kg/m     ASSESSMENT/PLAN:  1. Elevated blood-pressure reading without diagnosis of hypertension Blood pressure is normal today.  Patient's responding to her DASH diet.  2. Prediabetes By history   Patient Instructions  You will see Dr Tracie HarrierHagler next visit Come in the fall for a physical and labs  Call sooner for problems   Letta PateYvonne Sue  Meda Coffee, MD

## 2017-12-21 ENCOUNTER — Encounter: Payer: Self-pay | Admitting: Family Medicine

## 2018-01-09 DIAGNOSIS — Z3040 Encounter for surveillance of contraceptives, unspecified: Secondary | ICD-10-CM | POA: Diagnosis not present

## 2018-01-09 DIAGNOSIS — Z6833 Body mass index (BMI) 33.0-33.9, adult: Secondary | ICD-10-CM | POA: Diagnosis not present

## 2018-01-09 DIAGNOSIS — Z01419 Encounter for gynecological examination (general) (routine) without abnormal findings: Secondary | ICD-10-CM | POA: Diagnosis not present

## 2018-01-09 DIAGNOSIS — G43829 Menstrual migraine, not intractable, without status migrainosus: Secondary | ICD-10-CM | POA: Diagnosis not present

## 2018-01-09 DIAGNOSIS — B009 Herpesviral infection, unspecified: Secondary | ICD-10-CM | POA: Diagnosis not present

## 2018-01-09 DIAGNOSIS — F439 Reaction to severe stress, unspecified: Secondary | ICD-10-CM | POA: Diagnosis not present

## 2018-01-09 DIAGNOSIS — Z124 Encounter for screening for malignant neoplasm of cervix: Secondary | ICD-10-CM | POA: Diagnosis not present

## 2018-03-12 ENCOUNTER — Other Ambulatory Visit: Payer: Self-pay | Admitting: Obstetrics and Gynecology

## 2018-03-12 DIAGNOSIS — R51 Headache: Secondary | ICD-10-CM | POA: Diagnosis not present

## 2018-03-12 DIAGNOSIS — Z1231 Encounter for screening mammogram for malignant neoplasm of breast: Secondary | ICD-10-CM

## 2018-03-12 DIAGNOSIS — G43829 Menstrual migraine, not intractable, without status migrainosus: Secondary | ICD-10-CM | POA: Diagnosis not present

## 2018-03-15 DIAGNOSIS — H52222 Regular astigmatism, left eye: Secondary | ICD-10-CM | POA: Diagnosis not present

## 2018-03-15 DIAGNOSIS — H524 Presbyopia: Secondary | ICD-10-CM | POA: Diagnosis not present

## 2018-03-15 DIAGNOSIS — H52221 Regular astigmatism, right eye: Secondary | ICD-10-CM | POA: Diagnosis not present

## 2018-03-15 DIAGNOSIS — H5201 Hypermetropia, right eye: Secondary | ICD-10-CM | POA: Diagnosis not present

## 2018-04-12 ENCOUNTER — Ambulatory Visit
Admission: RE | Admit: 2018-04-12 | Discharge: 2018-04-12 | Disposition: A | Discharge status: Home / Self Care | Payer: 59 | Source: Ambulatory Visit | Attending: Obstetrics and Gynecology | Admitting: Obstetrics and Gynecology

## 2018-04-12 DIAGNOSIS — Z1231 Encounter for screening mammogram for malignant neoplasm of breast: Secondary | ICD-10-CM | POA: Diagnosis not present

## 2018-04-17 DIAGNOSIS — G43829 Menstrual migraine, not intractable, without status migrainosus: Secondary | ICD-10-CM | POA: Diagnosis not present

## 2018-04-17 DIAGNOSIS — R7303 Prediabetes: Secondary | ICD-10-CM | POA: Diagnosis not present

## 2018-04-17 DIAGNOSIS — Z1331 Encounter for screening for depression: Secondary | ICD-10-CM | POA: Diagnosis not present

## 2018-04-17 DIAGNOSIS — R030 Elevated blood-pressure reading, without diagnosis of hypertension: Secondary | ICD-10-CM | POA: Diagnosis not present

## 2018-04-17 DIAGNOSIS — Z136 Encounter for screening for cardiovascular disorders: Secondary | ICD-10-CM | POA: Diagnosis not present

## 2018-04-18 LAB — COMPLETE METABOLIC PANEL WITH GFR
AG RATIO: 1.6 (calc) (ref 1.0–2.5)
ALT: 69 U/L — ABNORMAL HIGH (ref 6–29)
AST: 33 U/L (ref 10–35)
Albumin: 4.4 g/dL (ref 3.6–5.1)
Alkaline phosphatase (APISO): 62 U/L (ref 33–115)
BILIRUBIN TOTAL: 1.8 mg/dL — AB (ref 0.2–1.2)
BUN: 7 mg/dL (ref 7–25)
CHLORIDE: 101 mmol/L (ref 98–110)
CO2: 25 mmol/L (ref 20–32)
Calcium: 9.8 mg/dL (ref 8.6–10.2)
Creat: 0.64 mg/dL (ref 0.50–1.10)
GFR, EST NON AFRICAN AMERICAN: 106 mL/min/{1.73_m2} (ref >=60)
GFR, Est African American: 123 mL/min/{1.73_m2} (ref >=60)
Globulin: 2.8 g/dL (calc) (ref 1.9–3.7)
Glucose, Bld: 116 mg/dL (ref 65–139)
POTASSIUM: 3.9 mmol/L (ref 3.5–5.3)
Sodium: 136 mmol/L (ref 135–146)
TOTAL PROTEIN: 7.2 g/dL (ref 6.1–8.1)

## 2018-04-18 LAB — URINALYSIS, ROUTINE W REFLEX MICROSCOPIC
BILIRUBIN URINE: NEGATIVE
Glucose, UA: NEGATIVE
Hgb urine dipstick: NEGATIVE
Ketones, ur: NEGATIVE
Leukocytes, UA: NEGATIVE
Nitrite: NEGATIVE
PH: 6 (ref 5.0–8.0)
Protein, ur: NEGATIVE
Specific Gravity, Urine: 1.007 (ref 1.001–1.03)

## 2018-04-18 LAB — CBC
HCT: 37 % (ref 35.0–45.0)
HEMOGLOBIN: 12.5 g/dL (ref 11.7–15.5)
MCH: 30.1 pg (ref 27.0–33.0)
MCHC: 33.8 g/dL (ref 32.0–36.0)
MCV: 89.2 fL (ref 80.0–100.0)
MPV: 9.9 fL (ref 7.5–12.5)
Platelets: 378 10*3/uL (ref 140–400)
RBC: 4.15 10*6/uL (ref 3.80–5.10)
RDW: 12.7 % (ref 11.0–15.0)
WBC: 9.9 10*3/uL (ref 3.8–10.8)

## 2018-04-18 LAB — HEMOGLOBIN A1C
Hgb A1c MFr Bld: 6.7 % of total Hgb — ABNORMAL HIGH (ref <5.7)
Mean Plasma Glucose: 146 (calc)
eAG (mmol/L): 8.1 (calc)

## 2018-04-18 LAB — LIPID PANEL
Cholesterol: 163 mg/dL (ref <200)
HDL: 38 mg/dL — ABNORMAL LOW (ref >50)
LDL CHOLESTEROL (CALC): 101 mg/dL — AB
NON-HDL CHOLESTEROL (CALC): 125 mg/dL (ref <130)
Total CHOL/HDL Ratio: 4.3 (calc) (ref <5.0)
Triglycerides: 141 mg/dL (ref <150)

## 2018-04-19 ENCOUNTER — Encounter (HOSPITAL_COMMUNITY): Payer: Self-pay | Admitting: Family Medicine

## 2018-04-23 ENCOUNTER — Encounter: Payer: 59 | Admitting: Family Medicine

## 2018-04-29 DIAGNOSIS — R21 Rash and other nonspecific skin eruption: Secondary | ICD-10-CM | POA: Diagnosis not present

## 2018-04-29 DIAGNOSIS — R748 Abnormal levels of other serum enzymes: Secondary | ICD-10-CM | POA: Diagnosis not present

## 2018-04-29 DIAGNOSIS — E1159 Type 2 diabetes mellitus with other circulatory complications: Secondary | ICD-10-CM | POA: Diagnosis not present

## 2018-04-29 DIAGNOSIS — I119 Hypertensive heart disease without heart failure: Secondary | ICD-10-CM | POA: Diagnosis not present

## 2018-07-02 DIAGNOSIS — I119 Hypertensive heart disease without heart failure: Secondary | ICD-10-CM | POA: Diagnosis not present

## 2018-07-02 DIAGNOSIS — E1159 Type 2 diabetes mellitus with other circulatory complications: Secondary | ICD-10-CM | POA: Diagnosis not present

## 2018-07-02 DIAGNOSIS — G43829 Menstrual migraine, not intractable, without status migrainosus: Secondary | ICD-10-CM | POA: Diagnosis not present

## 2018-08-02 MED FILL — metFORMIN HCL ER 500 MG TB2: 500 | 30 days supply | Qty: 30 | Fill #0

## 2018-08-16 DIAGNOSIS — I119 Hypertensive heart disease without heart failure: Secondary | ICD-10-CM | POA: Diagnosis not present

## 2018-08-16 DIAGNOSIS — E1159 Type 2 diabetes mellitus with other circulatory complications: Secondary | ICD-10-CM | POA: Diagnosis not present

## 2018-08-19 DIAGNOSIS — Z136 Encounter for screening for cardiovascular disorders: Secondary | ICD-10-CM | POA: Diagnosis not present

## 2018-08-19 DIAGNOSIS — E1159 Type 2 diabetes mellitus with other circulatory complications: Secondary | ICD-10-CM | POA: Diagnosis not present

## 2018-08-19 DIAGNOSIS — R7303 Prediabetes: Secondary | ICD-10-CM | POA: Diagnosis not present

## 2018-08-19 DIAGNOSIS — I119 Hypertensive heart disease without heart failure: Secondary | ICD-10-CM | POA: Diagnosis not present

## 2018-08-19 DIAGNOSIS — G43829 Menstrual migraine, not intractable, without status migrainosus: Secondary | ICD-10-CM | POA: Diagnosis not present

## 2018-08-19 DIAGNOSIS — R030 Elevated blood-pressure reading, without diagnosis of hypertension: Secondary | ICD-10-CM | POA: Diagnosis not present

## 2018-08-19 DIAGNOSIS — E782 Mixed hyperlipidemia: Secondary | ICD-10-CM | POA: Diagnosis not present

## 2018-12-13 DIAGNOSIS — G43829 Menstrual migraine, not intractable, without status migrainosus: Secondary | ICD-10-CM | POA: Diagnosis not present

## 2018-12-13 DIAGNOSIS — Z136 Encounter for screening for cardiovascular disorders: Secondary | ICD-10-CM | POA: Diagnosis not present

## 2018-12-13 DIAGNOSIS — R7303 Prediabetes: Secondary | ICD-10-CM | POA: Diagnosis not present

## 2018-12-13 DIAGNOSIS — R030 Elevated blood-pressure reading, without diagnosis of hypertension: Secondary | ICD-10-CM | POA: Diagnosis not present

## 2018-12-13 DIAGNOSIS — E1159 Type 2 diabetes mellitus with other circulatory complications: Secondary | ICD-10-CM | POA: Diagnosis not present

## 2018-12-17 DIAGNOSIS — I119 Hypertensive heart disease without heart failure: Secondary | ICD-10-CM | POA: Diagnosis not present

## 2018-12-17 DIAGNOSIS — E782 Mixed hyperlipidemia: Secondary | ICD-10-CM | POA: Diagnosis not present

## 2018-12-17 DIAGNOSIS — E1159 Type 2 diabetes mellitus with other circulatory complications: Secondary | ICD-10-CM | POA: Diagnosis not present

## 2018-12-22 IMAGING — CT CT RENAL STONE PROTOCOL
2 of 4 series · 17 of 46 positions shown, 19 images · non-contrast
Comparison: None.

CLINICAL DATA: Acute right flank pain

EXAM:
CT ABDOMEN AND PELVIS WITHOUT CONTRAST
TECHNIQUE: Multidetector CT imaging of the abdomen and pelvis was performed
following the standard protocol without IV contrast.

[Series 2: axial st · axial · 0.78mm/px · z∈[-447,-52]mm · 14 of 87 slices shown, 16 images]
[im 4/87  soft-tissue]
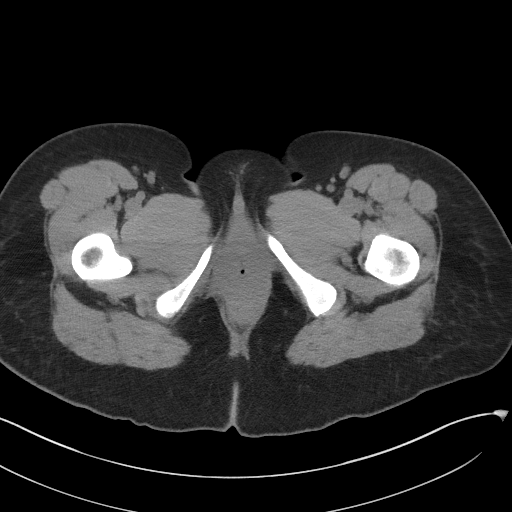
[im 4/87  bone]
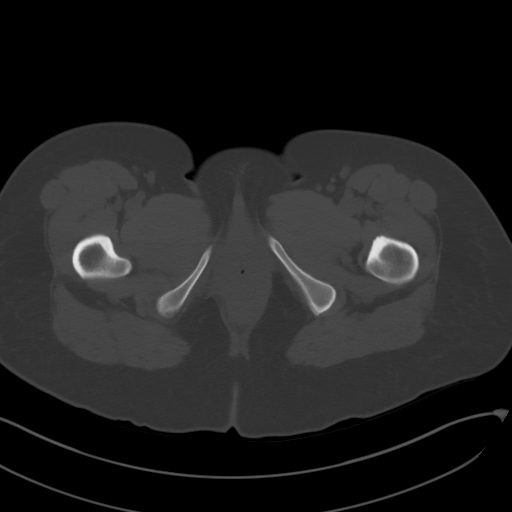
[im 11/87  soft-tissue]
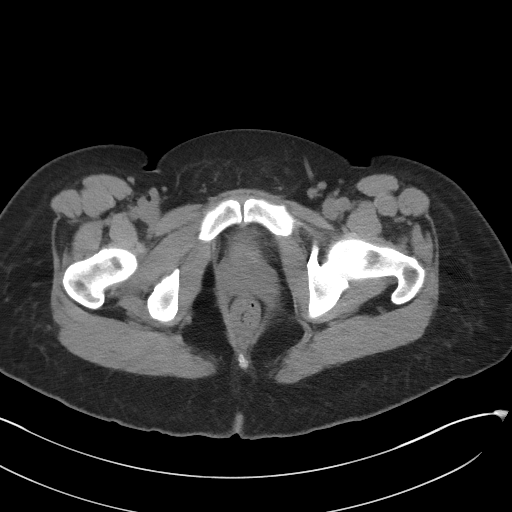
[im 18/87  soft-tissue]
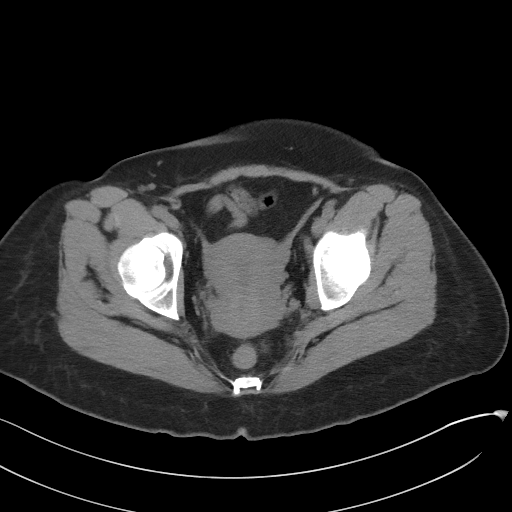
[im 25/87  soft-tissue]
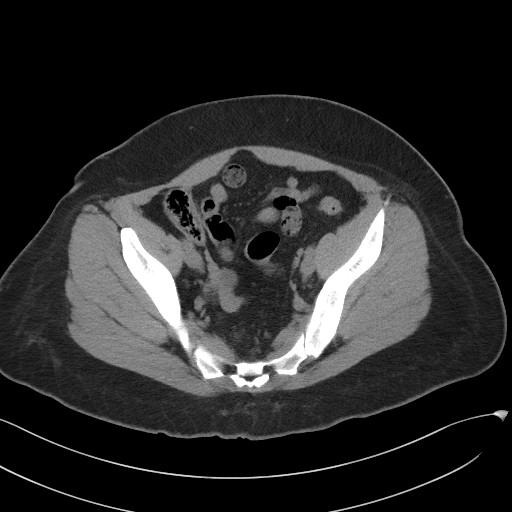
[im 28/87  soft-tissue]
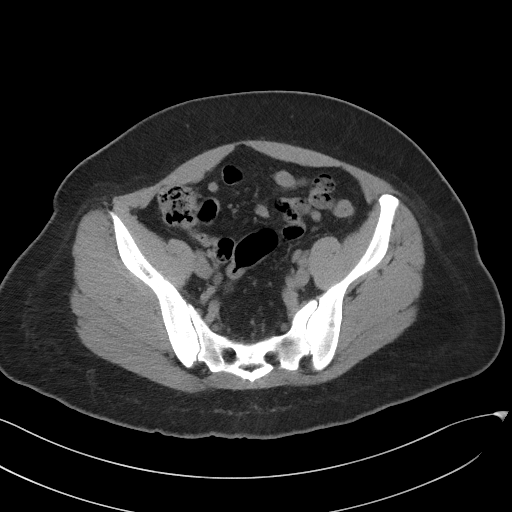
[im 35/87  soft-tissue]
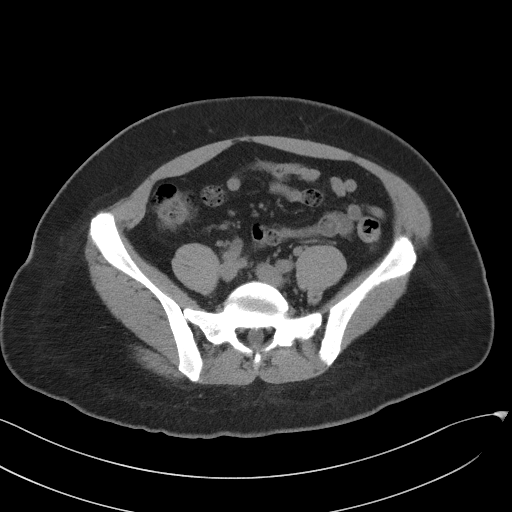
[im 42/87  soft-tissue]
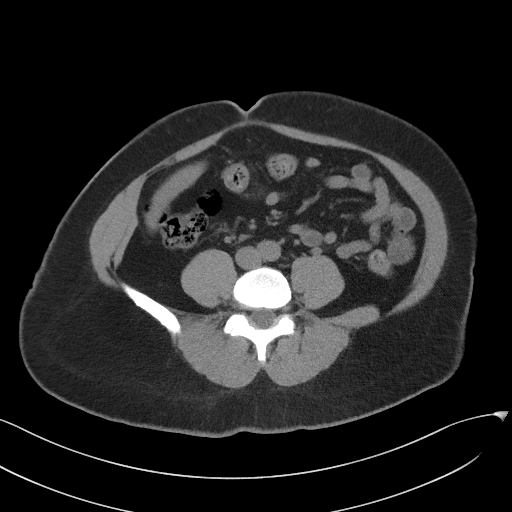
[im 45/87  soft-tissue]
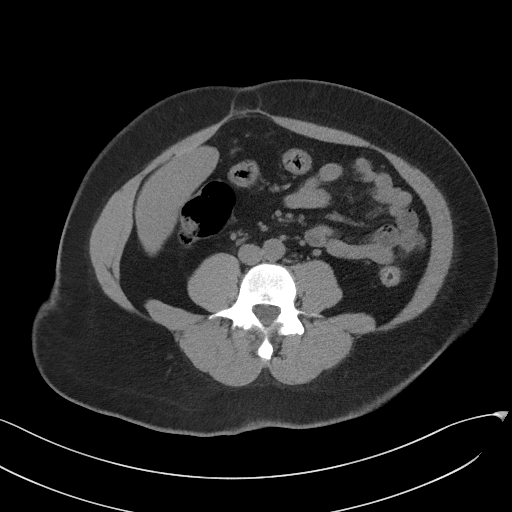
[im 52/87  soft-tissue]
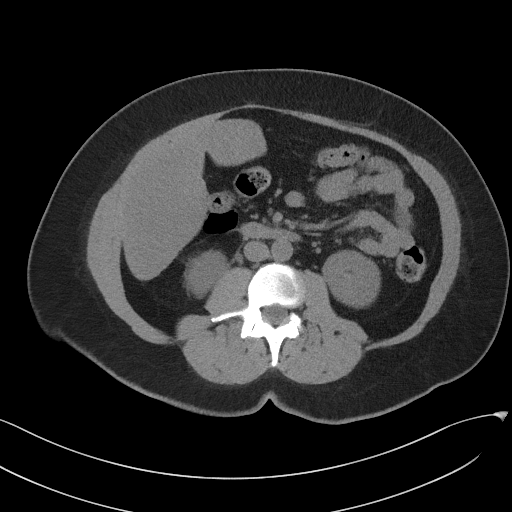
[im 52/87  bone]
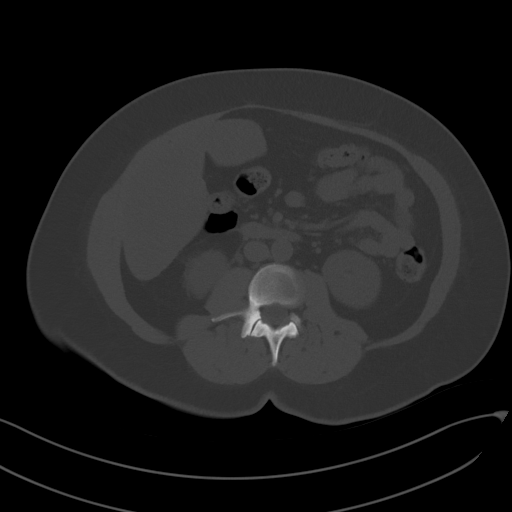
[im 59/87  soft-tissue]
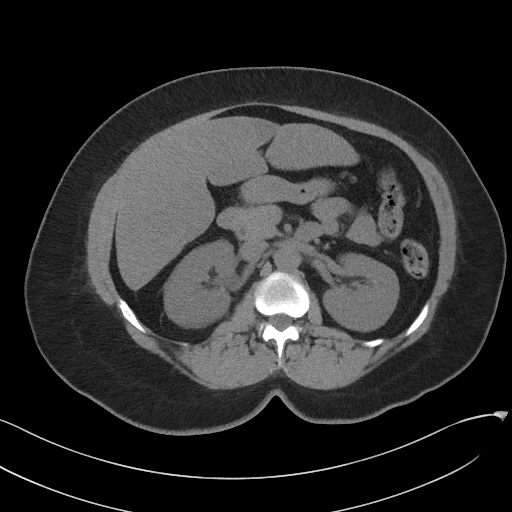
[im 66/87  soft-tissue]
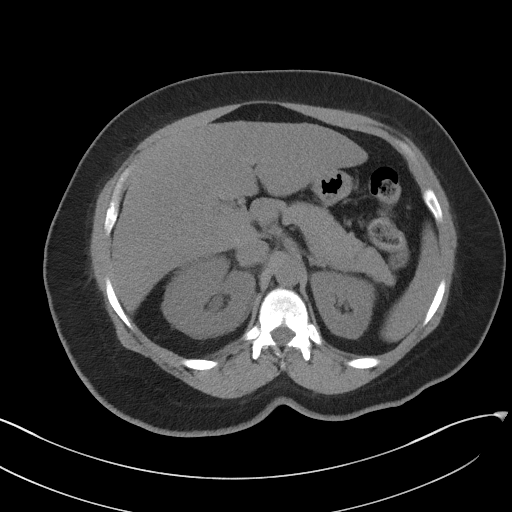
[im 69/87  soft-tissue]
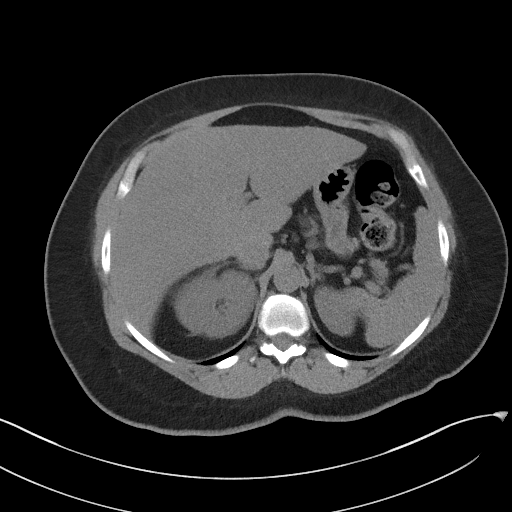
[im 76/87  soft-tissue]
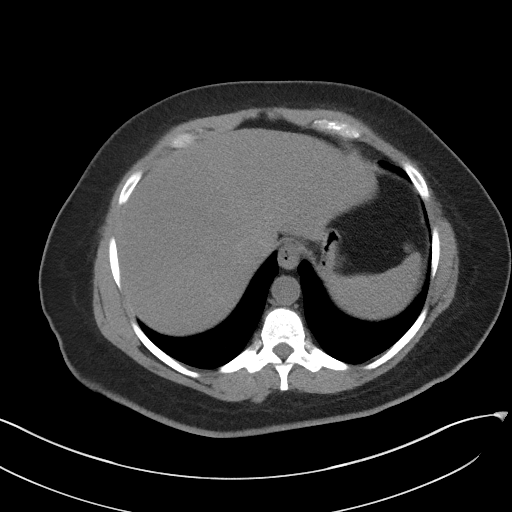
[im 83/87  soft-tissue]
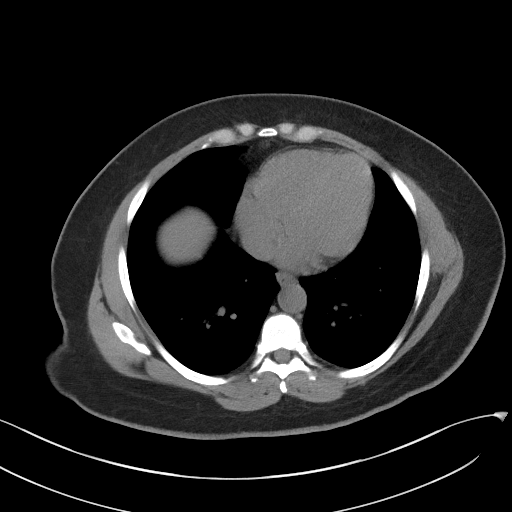

[Series 5: coronal st · coronal · 0.73mm/px · 3 of 89 slices shown]
[im 30/89  soft-tissue]
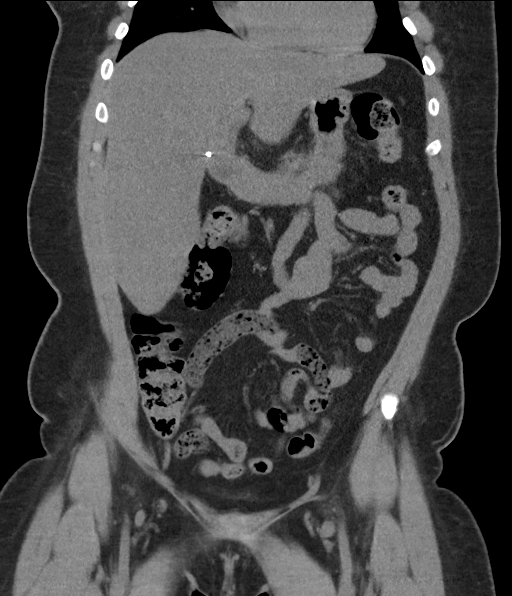
[im 40/89  soft-tissue]
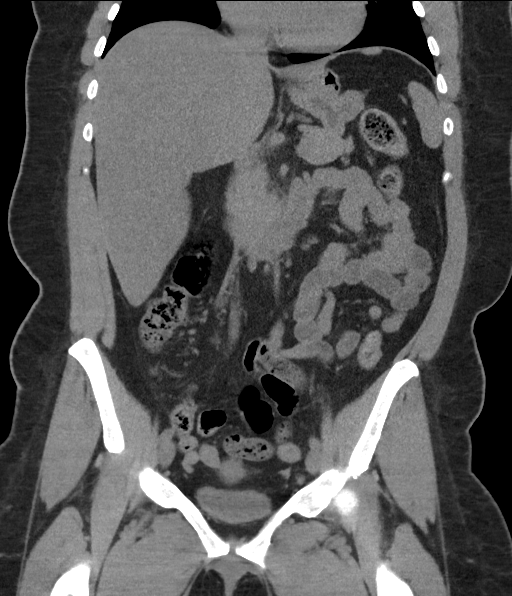
[im 49/89  soft-tissue]
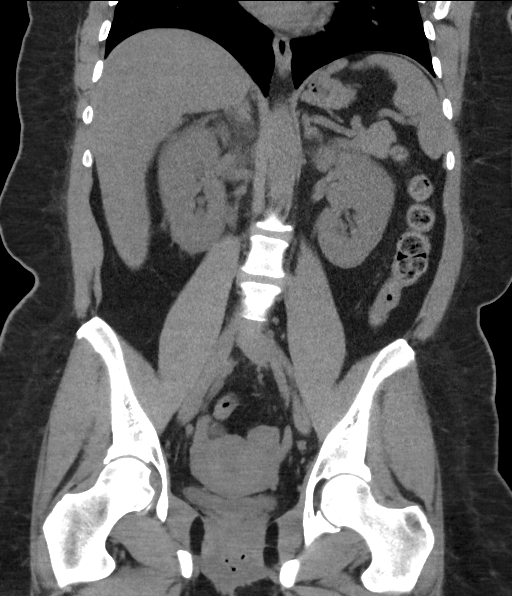

[17 of 46 positions shown; findings below may reference images not displayed]

FINDINGS: Lower chest:  No contributory findings.

Hepatobiliary: No focal liver abnormality.Hepatic steatosis.
Cholecystectomy. Normal common bile duct diameter.

Pancreas: Unremarkable.

Spleen: Unremarkable.

Adrenals/Urinary Tract: Negative adrenals. Right renal expansion,
perinephric edema, and hydroureteronephrosis secondary to a 2 mm
stone at the right UVJ. No additional urolithiasis. The bladder is
decompressed.

Stomach/Bowel:  No obstruction. No appendicitis.

Vascular/Lymphatic: No acute vascular abnormality. No mass or
adenopathy.

Reproductive:No pathologic findings.  Presumed follicle on the right

Other: No ascites or pneumoperitoneum.  Fatty umbilical hernia.

Musculoskeletal: No acute abnormalities.
IMPRESSION: 1. Obstructing 2 mm stone at the right UVJ.
2. Hepatic steatosis.

## 2018-12-27 MED FILL — JANUVIA 100 MG TABLET: 100 | 30 days supply | Qty: 30 | Fill #0

## 2018-12-27 MED FILL — ENALAPRIL MALEATE 5 MG TAB: 5 | 90 days supply | Qty: 90 | Fill #0

## 2019-02-11 MED FILL — JANUVIA 100 MG TABLET: 100 | 30 days supply | Qty: 30 | Fill #1

## 2019-03-04 DIAGNOSIS — Z6836 Body mass index (BMI) 36.0-36.9, adult: Secondary | ICD-10-CM | POA: Diagnosis not present

## 2019-03-04 DIAGNOSIS — N920 Excessive and frequent menstruation with regular cycle: Secondary | ICD-10-CM | POA: Diagnosis not present

## 2019-03-04 DIAGNOSIS — B009 Herpesviral infection, unspecified: Secondary | ICD-10-CM | POA: Diagnosis not present

## 2019-03-04 DIAGNOSIS — Z01419 Encounter for gynecological examination (general) (routine) without abnormal findings: Secondary | ICD-10-CM | POA: Diagnosis not present

## 2019-03-04 DIAGNOSIS — Z3040 Encounter for surveillance of contraceptives, unspecified: Secondary | ICD-10-CM | POA: Diagnosis not present

## 2019-03-19 DIAGNOSIS — E1159 Type 2 diabetes mellitus with other circulatory complications: Secondary | ICD-10-CM | POA: Diagnosis not present

## 2019-03-19 DIAGNOSIS — I119 Hypertensive heart disease without heart failure: Secondary | ICD-10-CM | POA: Diagnosis not present

## 2019-03-19 DIAGNOSIS — E782 Mixed hyperlipidemia: Secondary | ICD-10-CM | POA: Diagnosis not present

## 2019-03-20 MED FILL — JANUVIA 100 MG TABLET: 100 | 30 days supply | Qty: 30 | Fill #2

## 2019-03-25 DIAGNOSIS — E782 Mixed hyperlipidemia: Secondary | ICD-10-CM | POA: Diagnosis not present

## 2019-03-25 DIAGNOSIS — N939 Abnormal uterine and vaginal bleeding, unspecified: Secondary | ICD-10-CM | POA: Diagnosis not present

## 2019-03-25 DIAGNOSIS — E1159 Type 2 diabetes mellitus with other circulatory complications: Secondary | ICD-10-CM | POA: Diagnosis not present

## 2019-03-25 DIAGNOSIS — I119 Hypertensive heart disease without heart failure: Secondary | ICD-10-CM | POA: Diagnosis not present

## 2019-04-01 DIAGNOSIS — H5201 Hypermetropia, right eye: Secondary | ICD-10-CM | POA: Diagnosis not present

## 2019-04-01 DIAGNOSIS — H524 Presbyopia: Secondary | ICD-10-CM | POA: Diagnosis not present

## 2019-04-01 DIAGNOSIS — H52221 Regular astigmatism, right eye: Secondary | ICD-10-CM | POA: Diagnosis not present

## 2019-04-01 DIAGNOSIS — H52222 Regular astigmatism, left eye: Secondary | ICD-10-CM | POA: Diagnosis not present

## 2019-04-03 DIAGNOSIS — Z3040 Encounter for surveillance of contraceptives, unspecified: Secondary | ICD-10-CM | POA: Diagnosis not present

## 2019-04-03 DIAGNOSIS — N939 Abnormal uterine and vaginal bleeding, unspecified: Secondary | ICD-10-CM | POA: Diagnosis not present

## 2019-04-03 DIAGNOSIS — G43829 Menstrual migraine, not intractable, without status migrainosus: Secondary | ICD-10-CM | POA: Diagnosis not present

## 2019-04-03 DIAGNOSIS — N920 Excessive and frequent menstruation with regular cycle: Secondary | ICD-10-CM | POA: Diagnosis not present

## 2019-04-07 MED FILL — NORETHINDRONE 5 MG TABLET: 5 | 15 days supply | Qty: 30 | Fill #0

## 2019-04-21 MED FILL — JANUVIA 100 MG TABLET: 100 | 30 days supply | Qty: 30 | Fill #3

## 2019-05-01 MED FILL — AMLODIPINE BESYLATE 5 MG TA: 5 | 60 days supply | Qty: 60 | Fill #0

## 2019-05-01 MED FILL — ATORVASTATIN 10 MG TABLET: 10 | 90 days supply | Qty: 90 | Fill #0

## 2019-05-01 MED FILL — ENALAPRIL MALEATE 10 MG TAB: 10 | 90 days supply | Qty: 90 | Fill #0

## 2019-05-01 MED FILL — JANUVIA 100 MG TABLET: 100 | 30 days supply | Qty: 30 | Fill #3

## 2019-05-20 ENCOUNTER — Other Ambulatory Visit: Payer: Self-pay | Admitting: Obstetrics and Gynecology

## 2019-05-20 DIAGNOSIS — Z30430 Encounter for insertion of intrauterine contraceptive device: Secondary | ICD-10-CM | POA: Diagnosis not present

## 2019-05-20 DIAGNOSIS — Z30431 Encounter for routine checking of intrauterine contraceptive device: Secondary | ICD-10-CM | POA: Diagnosis not present

## 2019-05-20 DIAGNOSIS — N939 Abnormal uterine and vaginal bleeding, unspecified: Secondary | ICD-10-CM | POA: Diagnosis not present

## 2019-05-23 DIAGNOSIS — E1159 Type 2 diabetes mellitus with other circulatory complications: Secondary | ICD-10-CM | POA: Diagnosis not present

## 2019-05-23 DIAGNOSIS — I119 Hypertensive heart disease without heart failure: Secondary | ICD-10-CM | POA: Diagnosis not present

## 2019-05-23 DIAGNOSIS — E782 Mixed hyperlipidemia: Secondary | ICD-10-CM | POA: Diagnosis not present

## 2019-05-26 DIAGNOSIS — I119 Hypertensive heart disease without heart failure: Secondary | ICD-10-CM | POA: Diagnosis not present

## 2019-05-26 DIAGNOSIS — E782 Mixed hyperlipidemia: Secondary | ICD-10-CM | POA: Diagnosis not present

## 2019-05-26 DIAGNOSIS — E1159 Type 2 diabetes mellitus with other circulatory complications: Secondary | ICD-10-CM | POA: Diagnosis not present

## 2019-05-26 DIAGNOSIS — N939 Abnormal uterine and vaginal bleeding, unspecified: Secondary | ICD-10-CM | POA: Diagnosis not present

## 2019-06-09 MED FILL — JANUVIA 100 MG TABLET: 100 | 30 days supply | Qty: 30 | Fill #4

## 2019-07-30 MED FILL — JANUVIA 100 MG TABLET: 100 | 30 days supply | Qty: 30 | Fill #5

## 2019-08-11 DIAGNOSIS — N939 Abnormal uterine and vaginal bleeding, unspecified: Secondary | ICD-10-CM | POA: Diagnosis not present

## 2019-08-26 DIAGNOSIS — E782 Mixed hyperlipidemia: Secondary | ICD-10-CM | POA: Diagnosis not present

## 2019-08-26 DIAGNOSIS — I119 Hypertensive heart disease without heart failure: Secondary | ICD-10-CM | POA: Diagnosis not present

## 2019-08-26 DIAGNOSIS — E1159 Type 2 diabetes mellitus with other circulatory complications: Secondary | ICD-10-CM | POA: Diagnosis not present

## 2019-09-05 DIAGNOSIS — Z9119 Patient's noncompliance with other medical treatment and regimen: Secondary | ICD-10-CM | POA: Diagnosis not present

## 2019-09-05 DIAGNOSIS — I119 Hypertensive heart disease without heart failure: Secondary | ICD-10-CM | POA: Diagnosis not present

## 2019-09-05 DIAGNOSIS — E782 Mixed hyperlipidemia: Secondary | ICD-10-CM | POA: Diagnosis not present

## 2019-09-05 DIAGNOSIS — E1159 Type 2 diabetes mellitus with other circulatory complications: Secondary | ICD-10-CM | POA: Diagnosis not present

## 2019-09-05 MED FILL — AMLODIPINE BESYLATE 5 MG TA: 5 | 90 days supply | Qty: 90 | Fill #0

## 2019-09-05 MED FILL — FREESTYLE LITE METER: 1 days supply | Qty: 1 | Fill #0

## 2019-09-05 MED FILL — JARDIANCE 25 MG TABLET: 25 | 90 days supply | Qty: 90 | Fill #0

## 2019-09-05 MED FILL — FREESTYLE LANCETS: 90 days supply | Qty: 100 | Fill #0

## 2019-09-05 MED FILL — FREESTYLE LITE TEST STRIP: 50 days supply | Qty: 50 | Fill #0

## 2019-09-22 MED FILL — JANUVIA 100 MG TABLET: 100 | 60 days supply | Qty: 60 | Fill #0

## 2019-09-22 MED FILL — ENALAPRIL MALEATE 10 MG TAB: 10 | 60 days supply | Qty: 60 | Fill #1

## 2019-10-03 DIAGNOSIS — E782 Mixed hyperlipidemia: Secondary | ICD-10-CM | POA: Diagnosis not present

## 2019-10-03 DIAGNOSIS — I119 Hypertensive heart disease without heart failure: Secondary | ICD-10-CM | POA: Diagnosis not present

## 2019-10-03 DIAGNOSIS — E1159 Type 2 diabetes mellitus with other circulatory complications: Secondary | ICD-10-CM | POA: Diagnosis not present

## 2019-10-03 DIAGNOSIS — Z9119 Patient's noncompliance with other medical treatment and regimen: Secondary | ICD-10-CM | POA: Diagnosis not present

## 2019-12-02 MED FILL — ENALAPRIL MALEATE 10 MG TAB: 10 | 90 days supply | Qty: 90 | Fill #0

## 2019-12-02 MED FILL — FREESTYLE LANCETS: 90 days supply | Qty: 100 | Fill #1

## 2019-12-02 MED FILL — FREESTYLE LITE TEST STRIP: 50 days supply | Qty: 50 | Fill #1

## 2019-12-11 MED FILL — AMLODIPINE BESYLATE 5 MG TA: 5 | 90 days supply | Qty: 90 | Fill #1

## 2019-12-11 MED FILL — JARDIANCE 25 MG TABLET: 25 | 90 days supply | Qty: 90 | Fill #0

## 2019-12-24 ENCOUNTER — Other Ambulatory Visit: Payer: Self-pay | Admitting: Obstetrics and Gynecology

## 2019-12-24 DIAGNOSIS — Z1231 Encounter for screening mammogram for malignant neoplasm of breast: Secondary | ICD-10-CM

## 2020-01-05 DIAGNOSIS — E782 Mixed hyperlipidemia: Secondary | ICD-10-CM | POA: Diagnosis not present

## 2020-01-05 DIAGNOSIS — E1159 Type 2 diabetes mellitus with other circulatory complications: Secondary | ICD-10-CM | POA: Diagnosis not present

## 2020-01-05 DIAGNOSIS — I119 Hypertensive heart disease without heart failure: Secondary | ICD-10-CM | POA: Diagnosis not present

## 2020-01-05 MED FILL — JANUVIA 100 MG TABLET: 100 | 30 days supply | Qty: 30 | Fill #2

## 2020-01-13 ENCOUNTER — Ambulatory Visit
Admission: RE | Admit: 2020-01-13 | Discharge: 2020-01-13 | Disposition: A | Discharge status: Home / Self Care | Payer: 59 | Source: Ambulatory Visit | Attending: Obstetrics and Gynecology | Admitting: Obstetrics and Gynecology

## 2020-01-13 ENCOUNTER — Other Ambulatory Visit: Payer: Self-pay

## 2020-01-13 DIAGNOSIS — Z1231 Encounter for screening mammogram for malignant neoplasm of breast: Secondary | ICD-10-CM | POA: Diagnosis not present

## 2020-01-14 ENCOUNTER — Other Ambulatory Visit (HOSPITAL_COMMUNITY): Payer: Self-pay | Admitting: Family Medicine

## 2020-01-14 DIAGNOSIS — E1159 Type 2 diabetes mellitus with other circulatory complications: Secondary | ICD-10-CM | POA: Diagnosis not present

## 2020-01-14 DIAGNOSIS — E669 Obesity, unspecified: Secondary | ICD-10-CM | POA: Diagnosis not present

## 2020-01-14 DIAGNOSIS — Z6830 Body mass index (BMI) 30.0-30.9, adult: Secondary | ICD-10-CM | POA: Diagnosis not present

## 2020-01-14 DIAGNOSIS — I119 Hypertensive heart disease without heart failure: Secondary | ICD-10-CM | POA: Diagnosis not present

## 2020-01-14 DIAGNOSIS — E782 Mixed hyperlipidemia: Secondary | ICD-10-CM | POA: Diagnosis not present

## 2020-01-14 MED FILL — ATORVASTATIN CALCIUM 10 MG: 10 | 90 days supply | Qty: 90 | Fill #0

## 2020-01-27 MED FILL — ATORVASTATIN CALCIUM 10 MG: 10 | 60 days supply | Qty: 60 | Fill #1

## 2020-02-02 MED FILL — FREESTYLE LITE TEST STRIP: 50 days supply | Qty: 50 | Fill #2

## 2020-02-04 MED FILL — JANUVIA 100 MG TABLET: 100 | 30 days supply | Qty: 30 | Fill #3

## 2020-02-09 DIAGNOSIS — L293 Anogenital pruritus, unspecified: Secondary | ICD-10-CM | POA: Diagnosis not present

## 2020-03-08 MED FILL — JANUVIA 100 MG TABLET: 100 | 30 days supply | Qty: 30 | Fill #4

## 2020-03-08 MED FILL — ENALAPRIL MALEATE 10 MG TAB: 10 | 90 days supply | Qty: 90 | Fill #1

## 2020-03-16 MED FILL — AMLODIPINE BESYLATE 5 MG TA: 5 | 90 days supply | Qty: 90 | Fill #0

## 2020-03-16 MED FILL — JARDIANCE 25 MG TABLET: 25 | 90 days supply | Qty: 90 | Fill #0

## 2020-03-19 MED FILL — FREESTYLE LITE TEST STRIP: 50 days supply | Qty: 50 | Fill #3

## 2020-03-19 MED FILL — FREESTYLE LANCETS: 90 days supply | Qty: 100 | Fill #2

## 2020-03-25 DIAGNOSIS — Z01419 Encounter for gynecological examination (general) (routine) without abnormal findings: Secondary | ICD-10-CM | POA: Diagnosis not present

## 2020-03-25 DIAGNOSIS — Z6831 Body mass index (BMI) 31.0-31.9, adult: Secondary | ICD-10-CM | POA: Diagnosis not present

## 2020-03-25 DIAGNOSIS — Z3040 Encounter for surveillance of contraceptives, unspecified: Secondary | ICD-10-CM | POA: Diagnosis not present

## 2020-03-25 DIAGNOSIS — Z1239 Encounter for other screening for malignant neoplasm of breast: Secondary | ICD-10-CM | POA: Diagnosis not present

## 2020-03-25 DIAGNOSIS — Z1211 Encounter for screening for malignant neoplasm of colon: Secondary | ICD-10-CM | POA: Diagnosis not present

## 2020-03-25 DIAGNOSIS — R102 Pelvic and perineal pain: Secondary | ICD-10-CM | POA: Diagnosis not present

## 2020-04-02 DIAGNOSIS — H52222 Regular astigmatism, left eye: Secondary | ICD-10-CM | POA: Diagnosis not present

## 2020-04-02 DIAGNOSIS — H5201 Hypermetropia, right eye: Secondary | ICD-10-CM | POA: Diagnosis not present

## 2020-04-02 DIAGNOSIS — H52221 Regular astigmatism, right eye: Secondary | ICD-10-CM | POA: Diagnosis not present

## 2020-04-02 DIAGNOSIS — H524 Presbyopia: Secondary | ICD-10-CM | POA: Diagnosis not present

## 2020-04-06 DIAGNOSIS — R17 Unspecified jaundice: Secondary | ICD-10-CM | POA: Diagnosis not present

## 2020-04-06 DIAGNOSIS — K5900 Constipation, unspecified: Secondary | ICD-10-CM | POA: Diagnosis not present

## 2020-04-06 DIAGNOSIS — R194 Change in bowel habit: Secondary | ICD-10-CM | POA: Diagnosis not present

## 2020-04-06 DIAGNOSIS — E669 Obesity, unspecified: Secondary | ICD-10-CM | POA: Diagnosis not present

## 2020-04-06 DIAGNOSIS — Z1211 Encounter for screening for malignant neoplasm of colon: Secondary | ICD-10-CM | POA: Diagnosis not present

## 2020-04-08 MED FILL — JANUVIA 100 MG TABLET: 100 | 30 days supply | Qty: 30 | Fill #0

## 2020-04-13 DIAGNOSIS — R1032 Left lower quadrant pain: Secondary | ICD-10-CM | POA: Diagnosis not present

## 2020-04-13 DIAGNOSIS — N83202 Unspecified ovarian cyst, left side: Secondary | ICD-10-CM | POA: Diagnosis not present

## 2020-04-14 DIAGNOSIS — I119 Hypertensive heart disease without heart failure: Secondary | ICD-10-CM | POA: Diagnosis not present

## 2020-04-14 DIAGNOSIS — E782 Mixed hyperlipidemia: Secondary | ICD-10-CM | POA: Diagnosis not present

## 2020-04-14 DIAGNOSIS — E1159 Type 2 diabetes mellitus with other circulatory complications: Secondary | ICD-10-CM | POA: Diagnosis not present

## 2020-05-07 MED FILL — JANUVIA 100 MG TABLET: 100 | 30 days supply | Qty: 30 | Fill #1

## 2020-05-21 ENCOUNTER — Other Ambulatory Visit (HOSPITAL_COMMUNITY): Payer: Self-pay | Admitting: Family Medicine

## 2020-05-21 MED FILL — FREESTYLE LITE TEST STRIP: 50 days supply | Qty: 50 | Fill #0

## 2020-05-28 DIAGNOSIS — Z7984 Long term (current) use of oral hypoglycemic drugs: Secondary | ICD-10-CM | POA: Diagnosis not present

## 2020-05-28 DIAGNOSIS — I10 Essential (primary) hypertension: Secondary | ICD-10-CM | POA: Diagnosis not present

## 2020-05-28 DIAGNOSIS — E1165 Type 2 diabetes mellitus with hyperglycemia: Secondary | ICD-10-CM | POA: Diagnosis not present

## 2020-05-28 DIAGNOSIS — Z1331 Encounter for screening for depression: Secondary | ICD-10-CM | POA: Diagnosis not present

## 2020-05-28 DIAGNOSIS — E782 Mixed hyperlipidemia: Secondary | ICD-10-CM | POA: Diagnosis not present

## 2020-05-28 DIAGNOSIS — Z7182 Exercise counseling: Secondary | ICD-10-CM | POA: Diagnosis not present

## 2020-05-28 DIAGNOSIS — Z79899 Other long term (current) drug therapy: Secondary | ICD-10-CM | POA: Diagnosis not present

## 2020-05-31 DIAGNOSIS — Z1211 Encounter for screening for malignant neoplasm of colon: Secondary | ICD-10-CM | POA: Diagnosis not present

## 2020-06-14 ENCOUNTER — Other Ambulatory Visit (HOSPITAL_COMMUNITY): Payer: Self-pay | Admitting: Family Medicine

## 2020-06-15 MED FILL — ENALAPRIL MALEATE 10 MG TAB: 10 | 90 days supply | Qty: 90 | Fill #0

## 2020-06-28 ENCOUNTER — Other Ambulatory Visit (HOSPITAL_COMMUNITY): Payer: Self-pay | Admitting: Family Medicine

## 2020-06-28 MED FILL — JARDIANCE 25 MG TABLET: 25 | 90 days supply | Qty: 90 | Fill #0

## 2020-06-28 MED FILL — AMLODIPINE BESYLATE 5 MG TA: 5 | 90 days supply | Qty: 90 | Fill #0

## 2020-07-13 DIAGNOSIS — N83202 Unspecified ovarian cyst, left side: Secondary | ICD-10-CM | POA: Diagnosis not present

## 2020-07-14 MED FILL — FREESTYLE LANCETS: 90 days supply | Qty: 100 | Fill #3

## 2020-07-14 MED FILL — FREESTYLE LITE TEST STRIP: 50 days supply | Qty: 50 | Fill #1

## 2020-09-03 MED FILL — FREESTYLE LITE TEST STRIP: 50 days supply | Qty: 50 | Fill #2

## 2020-09-07 DIAGNOSIS — L293 Anogenital pruritus, unspecified: Secondary | ICD-10-CM | POA: Diagnosis not present

## 2020-09-07 DIAGNOSIS — Z309 Encounter for contraceptive management, unspecified: Secondary | ICD-10-CM | POA: Diagnosis not present

## 2020-09-07 DIAGNOSIS — R81 Glycosuria: Secondary | ICD-10-CM | POA: Diagnosis not present

## 2020-09-07 DIAGNOSIS — R102 Pelvic and perineal pain: Secondary | ICD-10-CM | POA: Diagnosis not present

## 2020-09-15 DIAGNOSIS — I10 Essential (primary) hypertension: Secondary | ICD-10-CM | POA: Diagnosis not present

## 2020-09-15 DIAGNOSIS — E119 Type 2 diabetes mellitus without complications: Secondary | ICD-10-CM | POA: Diagnosis not present

## 2020-09-16 MED FILL — ENALAPRIL MALEATE 10 MG TAB: 10 | 90 days supply | Qty: 90 | Fill #0

## 2020-09-16 MED FILL — JANUVIA 100 MG TABLET: 100 | 30 days supply | Qty: 30 | Fill #1

## 2020-09-28 ENCOUNTER — Other Ambulatory Visit (HOSPITAL_COMMUNITY): Payer: Self-pay | Admitting: Family Medicine

## 2020-10-18 ENCOUNTER — Other Ambulatory Visit (HOSPITAL_COMMUNITY): Payer: Self-pay

## 2020-10-18 MED FILL — Glucose Blood Test Strip: 50 days supply | Qty: 50 | Fill #0 | Status: AC

## 2020-10-19 ENCOUNTER — Other Ambulatory Visit (HOSPITAL_COMMUNITY): Payer: Self-pay

## 2020-10-21 ENCOUNTER — Other Ambulatory Visit (HOSPITAL_COMMUNITY): Payer: Self-pay

## 2020-10-21 MED FILL — Sitagliptin Phosphate Tab 100 MG (Base Equiv): ORAL | 30 days supply | Qty: 30 | Fill #0 | Status: AC

## 2020-11-17 ENCOUNTER — Other Ambulatory Visit (HOSPITAL_COMMUNITY): Payer: Self-pay

## 2020-11-17 MED FILL — Sitagliptin Phosphate Tab 100 MG (Base Equiv): ORAL | 30 days supply | Qty: 30 | Fill #1 | Status: AC

## 2020-11-22 ENCOUNTER — Other Ambulatory Visit (HOSPITAL_COMMUNITY): Payer: Self-pay

## 2020-11-23 ENCOUNTER — Other Ambulatory Visit (HOSPITAL_COMMUNITY): Payer: Self-pay

## 2020-11-24 ENCOUNTER — Other Ambulatory Visit (HOSPITAL_COMMUNITY): Payer: Self-pay

## 2020-11-24 MED ORDER — GLUCOSE BLOOD VI STRP
ORAL_STRIP | 11 refills | Status: DC
  Filled 2020-11-24: qty 50, 50d supply, fill #0
  Filled 2021-02-21: qty 50, 50d supply, fill #1
  Filled 2021-04-04: qty 50, 50d supply, fill #2
  Filled 2021-05-31: qty 50, 50d supply, fill #3
  Filled 2021-08-31: qty 50, 50d supply, fill #4

## 2020-11-25 ENCOUNTER — Other Ambulatory Visit (HOSPITAL_COMMUNITY): Payer: Self-pay

## 2020-11-29 ENCOUNTER — Other Ambulatory Visit (HOSPITAL_COMMUNITY): Payer: Self-pay

## 2020-11-30 ENCOUNTER — Other Ambulatory Visit (HOSPITAL_COMMUNITY): Payer: Self-pay

## 2020-11-30 MED ORDER — FREESTYLE LANCETS MISC
11 refills | Status: DC
  Filled 2020-11-30: qty 100, 90d supply, fill #0
  Filled 2021-02-21: qty 100, 90d supply, fill #1
  Filled 2021-06-23: qty 100, 90d supply, fill #2
  Filled 2021-11-22: qty 100, 90d supply, fill #3

## 2020-12-21 ENCOUNTER — Other Ambulatory Visit (HOSPITAL_COMMUNITY): Payer: Self-pay

## 2020-12-21 MED FILL — Enalapril Maleate Tab 10 MG: ORAL | 90 days supply | Qty: 90 | Fill #0 | Status: AC

## 2020-12-21 MED FILL — Sitagliptin Phosphate Tab 100 MG (Base Equiv): ORAL | 30 days supply | Qty: 30 | Fill #2 | Status: AC

## 2020-12-28 DIAGNOSIS — L293 Anogenital pruritus, unspecified: Secondary | ICD-10-CM | POA: Diagnosis not present

## 2020-12-28 DIAGNOSIS — E119 Type 2 diabetes mellitus without complications: Secondary | ICD-10-CM | POA: Diagnosis not present

## 2020-12-28 DIAGNOSIS — N760 Acute vaginitis: Secondary | ICD-10-CM | POA: Diagnosis not present

## 2020-12-30 ENCOUNTER — Other Ambulatory Visit (HOSPITAL_COMMUNITY): Payer: Self-pay

## 2020-12-30 ENCOUNTER — Other Ambulatory Visit (HOSPITAL_COMMUNITY): Payer: Self-pay | Admitting: Family Medicine

## 2020-12-31 ENCOUNTER — Other Ambulatory Visit (HOSPITAL_COMMUNITY): Payer: Self-pay

## 2021-01-03 ENCOUNTER — Other Ambulatory Visit (HOSPITAL_COMMUNITY): Payer: Self-pay

## 2021-01-04 ENCOUNTER — Other Ambulatory Visit (HOSPITAL_COMMUNITY): Payer: Self-pay

## 2021-01-04 MED ORDER — JARDIANCE 25 MG PO TABS
25.0000 mg | ORAL_TABLET | Freq: Every day | ORAL | 1 refills | Status: AC
  Filled 2021-01-04: qty 90, 90d supply, fill #0
  Filled 2021-04-04: qty 90, 90d supply, fill #1

## 2021-01-04 MED ORDER — ATORVASTATIN CALCIUM 20 MG PO TABS
20.0000 mg | ORAL_TABLET | Freq: Every day | ORAL | 0 refills | Status: DC
  Filled 2021-01-04: qty 90, 90d supply, fill #0

## 2021-01-04 MED ORDER — AMLODIPINE BESYLATE 5 MG PO TABS
5.0000 mg | ORAL_TABLET | Freq: Every day | ORAL | 1 refills | Status: AC
  Filled 2021-01-04: qty 90, 90d supply, fill #0

## 2021-01-05 ENCOUNTER — Other Ambulatory Visit (HOSPITAL_COMMUNITY): Payer: Self-pay

## 2021-01-20 ENCOUNTER — Other Ambulatory Visit (HOSPITAL_COMMUNITY): Payer: Self-pay

## 2021-01-20 MED ORDER — JANUVIA 100 MG PO TABS
100.0000 mg | ORAL_TABLET | Freq: Every day | ORAL | 1 refills | Status: DC
  Filled 2021-01-20: qty 30, 30d supply, fill #0
  Filled 2021-02-21: qty 30, 30d supply, fill #1

## 2021-01-24 ENCOUNTER — Other Ambulatory Visit (HOSPITAL_COMMUNITY): Payer: Self-pay

## 2021-01-26 ENCOUNTER — Other Ambulatory Visit (HOSPITAL_COMMUNITY): Payer: Self-pay

## 2021-02-21 ENCOUNTER — Other Ambulatory Visit (HOSPITAL_COMMUNITY): Payer: Self-pay

## 2021-03-18 DIAGNOSIS — L292 Pruritus vulvae: Secondary | ICD-10-CM | POA: Diagnosis not present

## 2021-03-29 DIAGNOSIS — E785 Hyperlipidemia, unspecified: Secondary | ICD-10-CM | POA: Diagnosis not present

## 2021-03-29 DIAGNOSIS — Z1239 Encounter for other screening for malignant neoplasm of breast: Secondary | ICD-10-CM | POA: Diagnosis not present

## 2021-03-29 DIAGNOSIS — Z01419 Encounter for gynecological examination (general) (routine) without abnormal findings: Secondary | ICD-10-CM | POA: Diagnosis not present

## 2021-03-29 DIAGNOSIS — Z1231 Encounter for screening mammogram for malignant neoplasm of breast: Secondary | ICD-10-CM | POA: Diagnosis not present

## 2021-03-29 DIAGNOSIS — E119 Type 2 diabetes mellitus without complications: Secondary | ICD-10-CM | POA: Diagnosis not present

## 2021-03-29 DIAGNOSIS — B009 Herpesviral infection, unspecified: Secondary | ICD-10-CM | POA: Diagnosis not present

## 2021-03-29 DIAGNOSIS — Z3040 Encounter for surveillance of contraceptives, unspecified: Secondary | ICD-10-CM | POA: Diagnosis not present

## 2021-03-29 DIAGNOSIS — Z1211 Encounter for screening for malignant neoplasm of colon: Secondary | ICD-10-CM | POA: Diagnosis not present

## 2021-03-29 DIAGNOSIS — I10 Essential (primary) hypertension: Secondary | ICD-10-CM | POA: Diagnosis not present

## 2021-03-29 DIAGNOSIS — Z6831 Body mass index (BMI) 31.0-31.9, adult: Secondary | ICD-10-CM | POA: Diagnosis not present

## 2021-03-30 DIAGNOSIS — Z0000 Encounter for general adult medical examination without abnormal findings: Secondary | ICD-10-CM | POA: Diagnosis not present

## 2021-04-01 ENCOUNTER — Other Ambulatory Visit (HOSPITAL_COMMUNITY): Payer: Self-pay

## 2021-04-01 MED ORDER — ATORVASTATIN CALCIUM 20 MG PO TABS
20.0000 mg | ORAL_TABLET | Freq: Every day | ORAL | 3 refills | Status: AC
  Filled 2021-04-01 – 2021-04-04 (×2): qty 90, 90d supply, fill #0
  Filled 2021-07-06: qty 90, 90d supply, fill #1
  Filled 2021-11-03: qty 90, 90d supply, fill #2

## 2021-04-01 MED ORDER — SITAGLIPTIN PHOSPHATE 100 MG PO TABS
100.0000 mg | ORAL_TABLET | Freq: Every day | ORAL | 3 refills | Status: AC
  Filled 2021-04-01: qty 90, 90d supply, fill #0
  Filled 2021-04-04: qty 30, 30d supply, fill #0

## 2021-04-01 MED ORDER — AMLODIPINE BESYLATE 5 MG PO TABS
5.0000 mg | ORAL_TABLET | Freq: Every day | ORAL | 3 refills | Status: AC
  Filled 2021-04-01: qty 90, 90d supply, fill #0
  Filled 2021-07-06: qty 90, 90d supply, fill #1
  Filled 2021-11-03: qty 90, 90d supply, fill #2

## 2021-04-01 MED ORDER — ENALAPRIL MALEATE 10 MG PO TABS
10.0000 mg | ORAL_TABLET | Freq: Every day | ORAL | 3 refills | Status: AC
  Filled 2021-04-01: qty 90, 90d supply, fill #0
  Filled 2021-07-06: qty 90, 90d supply, fill #1
  Filled 2021-11-03: qty 90, 90d supply, fill #2

## 2021-04-01 MED ORDER — EMPAGLIFLOZIN 25 MG PO TABS
25.0000 mg | ORAL_TABLET | Freq: Every day | ORAL | 3 refills | Status: AC
  Filled 2021-04-01: qty 90, 90d supply, fill #0

## 2021-04-04 ENCOUNTER — Other Ambulatory Visit (HOSPITAL_COMMUNITY): Payer: Self-pay

## 2021-04-06 DIAGNOSIS — E559 Vitamin D deficiency, unspecified: Secondary | ICD-10-CM | POA: Diagnosis not present

## 2021-04-06 DIAGNOSIS — I10 Essential (primary) hypertension: Secondary | ICD-10-CM | POA: Diagnosis not present

## 2021-04-06 DIAGNOSIS — E785 Hyperlipidemia, unspecified: Secondary | ICD-10-CM | POA: Diagnosis not present

## 2021-04-06 DIAGNOSIS — E119 Type 2 diabetes mellitus without complications: Secondary | ICD-10-CM | POA: Diagnosis not present

## 2021-04-19 ENCOUNTER — Other Ambulatory Visit (HOSPITAL_COMMUNITY): Payer: Self-pay

## 2021-04-19 DIAGNOSIS — E559 Vitamin D deficiency, unspecified: Secondary | ICD-10-CM | POA: Diagnosis not present

## 2021-04-19 DIAGNOSIS — E7800 Pure hypercholesterolemia, unspecified: Secondary | ICD-10-CM | POA: Diagnosis not present

## 2021-04-19 DIAGNOSIS — E1165 Type 2 diabetes mellitus with hyperglycemia: Secondary | ICD-10-CM | POA: Diagnosis not present

## 2021-04-19 DIAGNOSIS — I10 Essential (primary) hypertension: Secondary | ICD-10-CM | POA: Diagnosis not present

## 2021-04-19 MED ORDER — TRULICITY 0.75 MG/0.5ML ~~LOC~~ SOAJ
0.7500 mg | SUBCUTANEOUS | 2 refills | Status: AC
  Filled 2021-04-19: qty 6, 84d supply, fill #0

## 2021-04-19 MED ORDER — JARDIANCE 10 MG PO TABS
10.0000 mg | ORAL_TABLET | Freq: Every day | ORAL | 6 refills | Status: DC
  Filled 2021-04-19 – 2021-10-20 (×2): qty 30, 30d supply, fill #0
  Filled 2021-11-24: qty 30, 30d supply, fill #1
  Filled 2022-01-26: qty 30, 30d supply, fill #2
  Filled 2022-03-16: qty 30, 30d supply, fill #3

## 2021-04-20 ENCOUNTER — Other Ambulatory Visit (HOSPITAL_COMMUNITY): Payer: Self-pay

## 2021-04-21 DIAGNOSIS — N898 Other specified noninflammatory disorders of vagina: Secondary | ICD-10-CM | POA: Diagnosis not present

## 2021-04-21 DIAGNOSIS — R21 Rash and other nonspecific skin eruption: Secondary | ICD-10-CM | POA: Diagnosis not present

## 2021-04-29 ENCOUNTER — Other Ambulatory Visit (HOSPITAL_COMMUNITY): Payer: Self-pay

## 2021-04-29 MED ORDER — VALACYCLOVIR HCL 500 MG PO TABS
500.0000 mg | ORAL_TABLET | Freq: Every day | ORAL | 7 refills | Status: AC
  Filled 2021-04-29 – 2021-05-18 (×2): qty 30, 30d supply, fill #0
  Filled 2021-08-09: qty 30, 30d supply, fill #1

## 2021-05-09 ENCOUNTER — Other Ambulatory Visit (HOSPITAL_COMMUNITY): Payer: Self-pay

## 2021-05-11 ENCOUNTER — Other Ambulatory Visit (HOSPITAL_COMMUNITY): Payer: Self-pay

## 2021-05-18 ENCOUNTER — Other Ambulatory Visit (HOSPITAL_COMMUNITY): Payer: Self-pay

## 2021-05-31 ENCOUNTER — Other Ambulatory Visit (HOSPITAL_COMMUNITY): Payer: Self-pay

## 2021-06-23 ENCOUNTER — Other Ambulatory Visit (HOSPITAL_COMMUNITY): Payer: Self-pay

## 2021-07-06 ENCOUNTER — Other Ambulatory Visit (HOSPITAL_COMMUNITY): Payer: Self-pay

## 2021-07-08 ENCOUNTER — Other Ambulatory Visit (HOSPITAL_COMMUNITY): Payer: Self-pay

## 2021-07-20 ENCOUNTER — Other Ambulatory Visit (HOSPITAL_COMMUNITY): Payer: Self-pay

## 2021-07-20 DIAGNOSIS — Z23 Encounter for immunization: Secondary | ICD-10-CM | POA: Diagnosis not present

## 2021-07-20 DIAGNOSIS — I10 Essential (primary) hypertension: Secondary | ICD-10-CM | POA: Diagnosis not present

## 2021-07-20 DIAGNOSIS — E7800 Pure hypercholesterolemia, unspecified: Secondary | ICD-10-CM | POA: Diagnosis not present

## 2021-07-20 DIAGNOSIS — E1165 Type 2 diabetes mellitus with hyperglycemia: Secondary | ICD-10-CM | POA: Diagnosis not present

## 2021-07-20 DIAGNOSIS — E559 Vitamin D deficiency, unspecified: Secondary | ICD-10-CM | POA: Diagnosis not present

## 2021-07-20 MED ORDER — MOUNJARO 7.5 MG/0.5ML ~~LOC~~ SOAJ
7.5000 mg | SUBCUTANEOUS | 6 refills | Status: DC
  Filled 2021-07-20: qty 2, 28d supply, fill #0
  Filled 2021-08-16: qty 2, 28d supply, fill #1
  Filled 2021-09-13: qty 2, 28d supply, fill #2
  Filled 2021-10-16 – 2021-11-22 (×2): qty 2, 28d supply, fill #3
  Filled 2021-12-19: qty 6, 84d supply, fill #4

## 2021-08-09 ENCOUNTER — Other Ambulatory Visit (HOSPITAL_COMMUNITY): Payer: Self-pay

## 2021-08-09 DIAGNOSIS — H52223 Regular astigmatism, bilateral: Secondary | ICD-10-CM | POA: Diagnosis not present

## 2021-08-09 DIAGNOSIS — E119 Type 2 diabetes mellitus without complications: Secondary | ICD-10-CM | POA: Diagnosis not present

## 2021-08-09 DIAGNOSIS — H2513 Age-related nuclear cataract, bilateral: Secondary | ICD-10-CM | POA: Diagnosis not present

## 2021-08-09 DIAGNOSIS — H53021 Refractive amblyopia, right eye: Secondary | ICD-10-CM | POA: Diagnosis not present

## 2021-08-16 ENCOUNTER — Other Ambulatory Visit (HOSPITAL_COMMUNITY): Payer: Self-pay

## 2021-08-17 ENCOUNTER — Other Ambulatory Visit (HOSPITAL_COMMUNITY): Payer: Self-pay

## 2021-08-31 ENCOUNTER — Other Ambulatory Visit (HOSPITAL_COMMUNITY): Payer: Self-pay

## 2021-09-14 ENCOUNTER — Other Ambulatory Visit (HOSPITAL_COMMUNITY): Payer: Self-pay

## 2021-09-20 DIAGNOSIS — Z23 Encounter for immunization: Secondary | ICD-10-CM | POA: Diagnosis not present

## 2021-10-19 ENCOUNTER — Other Ambulatory Visit (HOSPITAL_COMMUNITY): Payer: Self-pay

## 2021-10-20 ENCOUNTER — Other Ambulatory Visit (HOSPITAL_COMMUNITY): Payer: Self-pay

## 2021-10-20 MED ORDER — MOUNJARO 5 MG/0.5ML ~~LOC~~ SOAJ
5.0000 mg | SUBCUTANEOUS | 1 refills | Status: AC
  Filled 2021-10-20: qty 2, 28d supply, fill #0

## 2021-11-03 ENCOUNTER — Other Ambulatory Visit (HOSPITAL_COMMUNITY): Payer: Self-pay

## 2021-11-22 ENCOUNTER — Other Ambulatory Visit (HOSPITAL_COMMUNITY): Payer: Self-pay

## 2021-11-24 ENCOUNTER — Other Ambulatory Visit (HOSPITAL_COMMUNITY): Payer: Self-pay

## 2021-12-19 ENCOUNTER — Other Ambulatory Visit (HOSPITAL_COMMUNITY): Payer: Self-pay

## 2021-12-26 ENCOUNTER — Other Ambulatory Visit (HOSPITAL_COMMUNITY): Payer: Self-pay

## 2021-12-27 ENCOUNTER — Other Ambulatory Visit (HOSPITAL_COMMUNITY): Payer: Self-pay

## 2021-12-27 MED ORDER — FREESTYLE LITE TEST VI STRP
1.0000 | ORAL_STRIP | Freq: Every day | 11 refills | Status: DC
  Filled 2021-12-27: qty 50, 50d supply, fill #0
  Filled 2022-05-15 – 2022-05-26 (×2): qty 50, 50d supply, fill #1

## 2021-12-28 ENCOUNTER — Other Ambulatory Visit (HOSPITAL_COMMUNITY): Payer: Self-pay

## 2021-12-29 ENCOUNTER — Other Ambulatory Visit (HOSPITAL_COMMUNITY): Payer: Self-pay

## 2021-12-29 MED ORDER — VALACYCLOVIR HCL 500 MG PO TABS
500.0000 mg | ORAL_TABLET | Freq: Every day | ORAL | 2 refills | Status: AC
  Filled 2021-12-29 – 2022-01-12 (×2): qty 30, 30d supply, fill #0
  Filled 2022-07-25: qty 30, 30d supply, fill #1

## 2022-01-06 ENCOUNTER — Other Ambulatory Visit (HOSPITAL_COMMUNITY): Payer: Self-pay

## 2022-01-12 ENCOUNTER — Other Ambulatory Visit (HOSPITAL_COMMUNITY): Payer: Self-pay

## 2022-01-18 DIAGNOSIS — E7800 Pure hypercholesterolemia, unspecified: Secondary | ICD-10-CM | POA: Diagnosis not present

## 2022-01-18 DIAGNOSIS — E1165 Type 2 diabetes mellitus with hyperglycemia: Secondary | ICD-10-CM | POA: Diagnosis not present

## 2022-01-18 DIAGNOSIS — E559 Vitamin D deficiency, unspecified: Secondary | ICD-10-CM | POA: Diagnosis not present

## 2022-01-25 DIAGNOSIS — E7800 Pure hypercholesterolemia, unspecified: Secondary | ICD-10-CM | POA: Diagnosis not present

## 2022-01-25 DIAGNOSIS — E559 Vitamin D deficiency, unspecified: Secondary | ICD-10-CM | POA: Diagnosis not present

## 2022-01-25 DIAGNOSIS — I10 Essential (primary) hypertension: Secondary | ICD-10-CM | POA: Diagnosis not present

## 2022-01-25 DIAGNOSIS — E1165 Type 2 diabetes mellitus with hyperglycemia: Secondary | ICD-10-CM | POA: Diagnosis not present

## 2022-01-25 DIAGNOSIS — R6889 Other general symptoms and signs: Secondary | ICD-10-CM | POA: Diagnosis not present

## 2022-01-26 ENCOUNTER — Other Ambulatory Visit (HOSPITAL_COMMUNITY): Payer: Self-pay

## 2022-03-16 ENCOUNTER — Other Ambulatory Visit (HOSPITAL_COMMUNITY): Payer: Self-pay

## 2022-03-20 ENCOUNTER — Other Ambulatory Visit (HOSPITAL_COMMUNITY): Payer: Self-pay

## 2022-03-21 ENCOUNTER — Other Ambulatory Visit (HOSPITAL_COMMUNITY): Payer: Self-pay

## 2022-03-21 MED ORDER — MOUNJARO 7.5 MG/0.5ML ~~LOC~~ SOAJ
7.5000 mg | SUBCUTANEOUS | 6 refills | Status: AC
  Filled 2022-03-21: qty 6, 84d supply, fill #0
  Filled 2022-03-21: qty 2, 28d supply, fill #0
  Filled 2022-06-21: qty 6, 84d supply, fill #1

## 2022-03-30 ENCOUNTER — Other Ambulatory Visit (HOSPITAL_COMMUNITY): Payer: Self-pay

## 2022-04-03 DIAGNOSIS — Z3040 Encounter for surveillance of contraceptives, unspecified: Secondary | ICD-10-CM | POA: Diagnosis not present

## 2022-04-03 DIAGNOSIS — I10 Essential (primary) hypertension: Secondary | ICD-10-CM | POA: Diagnosis not present

## 2022-04-03 DIAGNOSIS — Z1231 Encounter for screening mammogram for malignant neoplasm of breast: Secondary | ICD-10-CM | POA: Diagnosis not present

## 2022-04-03 DIAGNOSIS — E119 Type 2 diabetes mellitus without complications: Secondary | ICD-10-CM | POA: Diagnosis not present

## 2022-04-03 DIAGNOSIS — E785 Hyperlipidemia, unspecified: Secondary | ICD-10-CM | POA: Diagnosis not present

## 2022-04-03 DIAGNOSIS — Z01419 Encounter for gynecological examination (general) (routine) without abnormal findings: Secondary | ICD-10-CM | POA: Diagnosis not present

## 2022-04-03 DIAGNOSIS — Z1211 Encounter for screening for malignant neoplasm of colon: Secondary | ICD-10-CM | POA: Diagnosis not present

## 2022-04-03 DIAGNOSIS — L659 Nonscarring hair loss, unspecified: Secondary | ICD-10-CM | POA: Diagnosis not present

## 2022-05-02 ENCOUNTER — Other Ambulatory Visit (HOSPITAL_COMMUNITY): Payer: Self-pay

## 2022-05-15 ENCOUNTER — Other Ambulatory Visit (HOSPITAL_COMMUNITY): Payer: Self-pay

## 2022-05-17 ENCOUNTER — Other Ambulatory Visit (HOSPITAL_COMMUNITY): Payer: Self-pay

## 2022-05-17 MED ORDER — JARDIANCE 10 MG PO TABS
10.0000 mg | ORAL_TABLET | Freq: Every day | ORAL | 6 refills | Status: DC
  Filled 2022-05-17 – 2022-05-26 (×2): qty 30, 30d supply, fill #0
  Filled 2022-07-17: qty 30, 30d supply, fill #1
  Filled 2022-08-30: qty 30, 30d supply, fill #2
  Filled 2022-10-26 (×2): qty 30, 30d supply, fill #3
  Filled 2022-12-12: qty 30, 30d supply, fill #4
  Filled 2023-01-18: qty 30, 30d supply, fill #5
  Filled 2023-02-26 (×2): qty 30, 30d supply, fill #6

## 2022-05-23 ENCOUNTER — Other Ambulatory Visit (HOSPITAL_COMMUNITY): Payer: Self-pay

## 2022-05-25 ENCOUNTER — Other Ambulatory Visit (HOSPITAL_COMMUNITY): Payer: Self-pay

## 2022-05-26 ENCOUNTER — Other Ambulatory Visit (HOSPITAL_COMMUNITY): Payer: Self-pay

## 2022-06-02 ENCOUNTER — Other Ambulatory Visit (HOSPITAL_COMMUNITY): Payer: Self-pay

## 2022-06-02 MED ORDER — ATORVASTATIN CALCIUM 20 MG PO TABS
20.0000 mg | ORAL_TABLET | Freq: Every day | ORAL | 3 refills | Status: DC
  Filled 2022-06-02: qty 90, 90d supply, fill #0
  Filled 2022-10-26 (×2): qty 90, 90d supply, fill #1
  Filled 2023-02-26 (×2): qty 90, 90d supply, fill #2

## 2022-06-02 MED ORDER — AMLODIPINE BESYLATE 5 MG PO TABS
5.0000 mg | ORAL_TABLET | Freq: Every day | ORAL | 3 refills | Status: AC
  Filled 2022-06-02: qty 90, 90d supply, fill #0
  Filled 2022-10-26 (×2): qty 90, 90d supply, fill #1

## 2022-06-02 MED ORDER — ENALAPRIL MALEATE 10 MG PO TABS
10.0000 mg | ORAL_TABLET | Freq: Every day | ORAL | 3 refills | Status: DC
  Filled 2022-06-02: qty 90, 90d supply, fill #0
  Filled 2022-10-26 (×2): qty 90, 90d supply, fill #1
  Filled 2023-02-26 (×2): qty 90, 90d supply, fill #2

## 2022-06-21 ENCOUNTER — Other Ambulatory Visit (HOSPITAL_COMMUNITY): Payer: Self-pay

## 2022-07-18 ENCOUNTER — Other Ambulatory Visit (HOSPITAL_COMMUNITY): Payer: Self-pay

## 2022-07-25 ENCOUNTER — Other Ambulatory Visit: Payer: Self-pay

## 2022-07-28 DIAGNOSIS — E1165 Type 2 diabetes mellitus with hyperglycemia: Secondary | ICD-10-CM | POA: Diagnosis not present

## 2022-07-28 DIAGNOSIS — E559 Vitamin D deficiency, unspecified: Secondary | ICD-10-CM | POA: Diagnosis not present

## 2022-07-28 DIAGNOSIS — E7800 Pure hypercholesterolemia, unspecified: Secondary | ICD-10-CM | POA: Diagnosis not present

## 2022-07-28 DIAGNOSIS — R6889 Other general symptoms and signs: Secondary | ICD-10-CM | POA: Diagnosis not present

## 2022-08-04 ENCOUNTER — Other Ambulatory Visit (HOSPITAL_COMMUNITY): Payer: Self-pay

## 2022-08-04 DIAGNOSIS — Z23 Encounter for immunization: Secondary | ICD-10-CM | POA: Diagnosis not present

## 2022-08-04 DIAGNOSIS — E1165 Type 2 diabetes mellitus with hyperglycemia: Secondary | ICD-10-CM | POA: Diagnosis not present

## 2022-08-04 DIAGNOSIS — I10 Essential (primary) hypertension: Secondary | ICD-10-CM | POA: Diagnosis not present

## 2022-08-04 DIAGNOSIS — E7800 Pure hypercholesterolemia, unspecified: Secondary | ICD-10-CM | POA: Diagnosis not present

## 2022-08-04 DIAGNOSIS — E559 Vitamin D deficiency, unspecified: Secondary | ICD-10-CM | POA: Diagnosis not present

## 2022-08-04 MED ORDER — MOUNJARO 5 MG/0.5ML ~~LOC~~ SOAJ
5.0000 mg | SUBCUTANEOUS | 5 refills | Status: DC
  Filled 2022-08-04 – 2022-08-25 (×2): qty 2, 28d supply, fill #0
  Filled 2022-10-02 – 2022-10-26 (×2): qty 2, 28d supply, fill #1
  Filled 2022-11-27: qty 2, 28d supply, fill #2
  Filled 2023-01-10: qty 2, 28d supply, fill #3
  Filled 2023-02-26 (×2): qty 2, 28d supply, fill #4
  Filled 2023-03-22: qty 2, 28d supply, fill #5

## 2022-08-11 DIAGNOSIS — E119 Type 2 diabetes mellitus without complications: Secondary | ICD-10-CM | POA: Diagnosis not present

## 2022-08-11 DIAGNOSIS — H2513 Age-related nuclear cataract, bilateral: Secondary | ICD-10-CM | POA: Diagnosis not present

## 2022-08-25 ENCOUNTER — Other Ambulatory Visit (HOSPITAL_COMMUNITY): Payer: Self-pay

## 2022-10-05 ENCOUNTER — Other Ambulatory Visit: Payer: Self-pay

## 2022-10-26 ENCOUNTER — Other Ambulatory Visit (HOSPITAL_COMMUNITY): Payer: Self-pay

## 2022-12-14 ENCOUNTER — Other Ambulatory Visit (HOSPITAL_COMMUNITY): Payer: Self-pay

## 2023-01-10 ENCOUNTER — Other Ambulatory Visit (HOSPITAL_COMMUNITY): Payer: Self-pay

## 2023-01-19 ENCOUNTER — Other Ambulatory Visit (HOSPITAL_COMMUNITY): Payer: Self-pay

## 2023-01-29 ENCOUNTER — Telehealth (HOSPITAL_COMMUNITY): Payer: Self-pay | Admitting: Internal Medicine

## 2023-01-29 ENCOUNTER — Ambulatory Visit (HOSPITAL_COMMUNITY)
Admission: EM | Admit: 2023-01-29 | Discharge: 2023-01-29 | Disposition: A | Discharge status: Home / Self Care | Payer: Commercial Managed Care - PPO

## 2023-01-29 ENCOUNTER — Encounter (HOSPITAL_COMMUNITY): Payer: Self-pay

## 2023-01-29 ENCOUNTER — Telehealth (HOSPITAL_COMMUNITY): Payer: Self-pay

## 2023-01-29 DIAGNOSIS — S80861A Insect bite (nonvenomous), right lower leg, initial encounter: Secondary | ICD-10-CM

## 2023-01-29 DIAGNOSIS — W57XXXA Bitten or stung by nonvenomous insect and other nonvenomous arthropods, initial encounter: Secondary | ICD-10-CM

## 2023-01-29 DIAGNOSIS — L03115 Cellulitis of right lower limb: Secondary | ICD-10-CM

## 2023-01-29 MED ORDER — CEPHALEXIN 500 MG PO CAPS: 500.0000 mg | ORAL_CAPSULE | Freq: Three times a day (TID) | ORAL | 0 refills | Status: AC

## 2023-01-29 NOTE — Telephone Encounter (Signed)
Medication not sent to pharmacy at visit. Telephone encounter used to send in keflex.

## 2023-01-29 NOTE — ED Triage Notes (Signed)
Pt c/o bug bite  to rt ankle a wk ago. States cleaned with peroxide, creams and alcohol with no relief.

## 2023-01-29 NOTE — ED Provider Notes (Signed)
[facility]    CSN: [id number] Arrival date & time: 01/29/23  1318      History   Chief Complaint Chief Complaint  Patient presents with   Insect Bite    HPI Sarah Roman is a 52 y.o. female.   Patient presents to urgent care for evaluation of insect bites to the right medial ankle that happened approximately 1 week ago.  Patient was walking in the grass while she was at church and noticed an ant to her leg when she sat down in her pew.  She suffered 3 bites to the medial right ankle resulting in subsequent ankle swelling and itching.  She states she saw some pus coming out of the site couple of days ago and became worried for infection.  She has been cleansing the wound with hydrogen peroxide, alcohol, and using Benadryl cream to the area for itch without much relief.  Reports discomfort and swelling to the right ankle at site of insect bite with erythema.  History of prediabetes, otherwise no history of immunosuppression.  Denies recent antibiotic/steroid use.     Past Medical History:  Diagnosis Date   Abnormal Pap smear    GERD (gastroesophageal reflux disease)    History of herpes simplex infection    Kidney stone on right side 07/24/2017    Patient Active Problem List   Diagnosis Date Noted   Kidney stone on right side 07/24/2017   Prediabetes 10/03/2016   Vitamin D deficiency 10/03/2016   Menstrual migraine without status migrainosus, not intractable 09/04/2016   Gastroesophageal reflux disease 09/04/2016   Obesity 10/14/2012   History of herpes simplex infection     Past Surgical History:  Procedure Laterality Date   CHOLECYSTECTOMY  11/24/1991   LEEP      OB History     Gravida  3   Para  3   Term      Preterm      AB      Living  3      SAB      IAB      Ectopic      Multiple      Live Births               Home Medications    Prior to Admission medications   Medication Sig Start Date End Date Taking?  Authorizing Provider  amLODipine (NORVASC) 5 MG tablet Take 1 tablet (5 mg total) by mouth daily. 01/04/21     amLODipine (NORVASC) 5 MG tablet Take 1 tablet (5 mg total) by mouth daily. 03/29/21     amLODipine (NORVASC) 5 MG tablet Take 1 tablet (5 mg total) by mouth daily. 04/09/22     atorvastatin (LIPITOR) 20 MG tablet Take 1 tablet (20 mg total) by mouth daily. 03/29/21     atorvastatin (LIPITOR) 20 MG tablet Take 1 tablet (20 mg total) by mouth daily. 04/09/22     Dulaglutide (TRULICITY) 0.75 MG/0.5ML SOPN Inject 0.75 mg into the skin once a week. 04/19/21     empagliflozin (JARDIANCE) 10 MG TABS tablet Take 1 tablet (10 mg total) by mouth daily. 05/17/22     empagliflozin (JARDIANCE) 25 MG TABS tablet Take 1 tablet (25 mg total) by mouth daily. 01/04/21     empagliflozin (JARDIANCE) 25 MG TABS tablet Take 1 tablet (25 mg total) by mouth daily. 03/29/21     enalapril (VASOTEC) 10 MG tablet Take 1 tablet (10 mg total) by mouth daily.  03/29/21     enalapril (VASOTEC) 10 MG tablet TAKE 1 TABLET BY MOUTH ONCE DAILY. 06/14/20 06/14/21  Doristine Bosworth, MD  enalapril (VASOTEC) 10 MG tablet TAKE 1 TABLET BY MOUTH ONCE A DAY 01/14/20 03/21/21  Purvis Sheffield, MD  enalapril (VASOTEC) 10 MG tablet Take 1 tablet (10 mg total) by mouth daily. 04/09/22     glucose blood (FREESTYLE LITE) test strip Use to test blood sugar once daily as directed. 12/26/21   Dorisann Frames, MD  Lancets (FREESTYLE) lancets Use as directed to check blood sugar daily. 11/30/20     levonorgestrel-ethinyl estradiol (ENPRESSE,TRIVORA) tablet Take 1 tablet by mouth daily. 09/27/12   Dillard, Samule Ohm, MD  sitaGLIPtin (JANUVIA) 100 MG tablet Take 1 tablet (100 mg total) by mouth daily. 03/29/21     sitaGLIPtin (JANUVIA) 100 MG tablet TAKE 1 TABLET BY MOUTH ONCE A DAY 01/14/20 01/19/21  Purvis Sheffield, MD  tirzepatide [facility]) 5 MG/0.5ML Pen Inject 5 mg into the skin once a week. 10/19/21     tirzepatide (MOUNJARO) 5 MG/0.5ML Pen Inject 5 mg  into the skin once a week. 08/04/22     tirzepatide (MOUNJARO) 7.5 MG/0.5ML Pen Inject 7.5 mg into the skin once a week. 03/20/22     valACYclovir (VALTREX) 500 MG tablet Take 500 mg by mouth 2 (two) times daily.    [provider]  valACYclovir (VALTREX) 500 MG tablet Take 1 tablet (500 mg total) by mouth daily. 08/27/20   Nigel Bridgeman, CNM  valACYclovir (VALTREX) 500 MG tablet Take 1 tablet (500 mg total) by mouth daily. 12/29/21       Family History Family History  Problem Relation Age of Onset   Stroke Maternal Grandmother    Diabetes Maternal Grandmother    Kidney disease Mother        dialysis, died at 71   Diabetes Mother    Heart disease Mother    Hyperlipidemia Mother    Hypertension Mother    Cancer Father        prostate   Heart disease Father        died at 47   Diabetes Father    Hypertension Father    Hyperlipidemia Father    Early death Maternal Grandfather        PE after surgery   Diabetes Paternal Grandmother    Cancer Paternal Grandfather        prostate   Early death Other 11       choking   Breast cancer Neg Hx     Social History Social History   Tobacco Use   Smoking status: Never   Smokeless tobacco: Never  Substance Use Topics   Alcohol use: Yes   Drug use: No     Allergies   Patient has no known allergies.   Review of Systems Review of Systems Per HPI  Physical Exam Triage Vital Signs ED Triage Vitals  Encounter Vitals Group     BP 01/29/23 1536 135/79     Systolic BP Percentile --      Diastolic BP Percentile --      Pulse Rate 01/29/23 1536 82     Resp 01/29/23 1536 18     Temp 01/29/23 1536 98.4 F (36.9 C)     Temp Source 01/29/23 1536 Oral     SpO2 01/29/23 1536 99 %     Weight --      Height --      Head Circumference --  Peak Flow --      Pain Score 01/29/23 1537 0     Pain Loc --      Pain Education --      Exclude from Growth Chart --    No data found.  Updated Vital Signs BP 135/79 (BP  Location: Left Arm)   Pulse 82   Temp 98.4 F (36.9 C) (Oral)   Resp 18   SpO2 99%   Visual Acuity Right Eye Distance:   Left Eye Distance:   Bilateral Distance:    Right Eye Near:   Left Eye Near:    Bilateral Near:     Physical Exam Vitals and nursing note reviewed.  Constitutional:      Appearance: She is not ill-appearing or toxic-appearing.  HENT:     Head: Normocephalic and atraumatic.     Right Ear: Hearing and external ear normal.     Left Ear: Hearing and external ear normal.     Nose: Nose normal.     Mouth/Throat:     Lips: Pink.  Eyes:     General: Lids are normal. Vision grossly intact. Gaze aligned appropriately.     Extraocular Movements: Extraocular movements intact.     Conjunctiva/sclera: Conjunctivae normal.  Pulmonary:     Effort: Pulmonary effort is normal.  Musculoskeletal:     Cervical back: Neck supple.  Skin:    General: Skin is warm and dry.     Capillary Refill: Capillary refill takes less than 2 seconds.     Findings: Lesion present. No rash.     Comments: 3 pustular lesions with approximately 2 to 3 cm in between in a linear fashion present to the medial aspect of the right ankle.  Diffuse soft tissue swelling of the right medial ankle present with small amount of erythema and warmth present to the surrounding pustules.  +2 dorsalis pedis pulse on the right foot.  Capillary refill less than 2 distally to rash.  Strength and sensation intact distally.  Neurological:     General: No focal deficit present.     Mental Status: She is alert and oriented to person, place, and time. Mental status is at baseline.     Cranial Nerves: No dysarthria or facial asymmetry.  Psychiatric:        Mood and Affect: Mood normal.        Speech: Speech normal.        Behavior: Behavior normal.        Thought Content: Thought content normal.        Judgment: Judgment normal.      UC Treatments / Results  Labs (all labs ordered are listed, but only abnormal  results are displayed) Labs Reviewed - No data to display  EKG   Radiology No results found.  Procedures Procedures (including critical care time)  Medications Ordered in UC Medications - No data to display  Initial Impression / Assessment and Plan / UC Course  I have reviewed the triage vital signs and the nursing notes.  Pertinent labs & imaging results that were available during my care of the patient were reviewed by me and considered in my medical decision making (see chart for details).   1.  Insect bite of right lower leg, cellulitis of right ankle Presentation consistent with cellulitis of the right ankle secondary to infected insect bites. No allergies to antibiotics.  Keflex sent to be taken as prescribed.  May continue using Benadryl cream as needed for itch.  Advised to monitor size of erythema and swelling and return if worsens in the next 24 to 48 hours despite antibiotic use.  No indication for imaging based on stable musculoskeletal exam findings.  She is neurovascularly intact distally to injury.  Counseled patient on potential for adverse effects with medications prescribed/recommended today, strict ER and return-to-clinic precautions discussed, patient verbalized understanding.    Final Clinical Impressions(s) / UC Diagnoses   Final diagnoses:  Insect bite of right lower leg, initial encounter  Cellulitis of right ankle     Discharge Instructions      Take Keflex antibiotic 3 times daily for infection to the right ankle.  You may take Tylenol as needed for any pain to the right ankle.  Continue applying Benadryl cream as needed for itching.  If you develop any new or worsening symptoms or if your symptoms do not start to improve, pleases return here or follow-up with your primary care provider. If your symptoms are severe, please go to the emergency room.    ED Prescriptions   None    PDMP not reviewed this encounter.   Carlisle Beers,  [place] 01/29/23 1710

## 2023-01-29 NOTE — Discharge Instructions (Signed)
Take Keflex antibiotic 3 times daily for infection to the right ankle.  You may take Tylenol as needed for any pain to the right ankle.  Continue applying Benadryl cream as needed for itching.  If you develop any new or worsening symptoms or if your symptoms do not start to improve, pleases return here or follow-up with your primary care provider. If your symptoms are severe, please go to the emergency room.

## 2023-01-30 DIAGNOSIS — E1165 Type 2 diabetes mellitus with hyperglycemia: Secondary | ICD-10-CM | POA: Diagnosis not present

## 2023-01-30 DIAGNOSIS — E7800 Pure hypercholesterolemia, unspecified: Secondary | ICD-10-CM | POA: Diagnosis not present

## 2023-01-30 DIAGNOSIS — E559 Vitamin D deficiency, unspecified: Secondary | ICD-10-CM | POA: Diagnosis not present

## 2023-01-30 DIAGNOSIS — R6889 Other general symptoms and signs: Secondary | ICD-10-CM | POA: Diagnosis not present

## 2023-02-06 DIAGNOSIS — E559 Vitamin D deficiency, unspecified: Secondary | ICD-10-CM | POA: Diagnosis not present

## 2023-02-06 DIAGNOSIS — I10 Essential (primary) hypertension: Secondary | ICD-10-CM | POA: Diagnosis not present

## 2023-02-06 DIAGNOSIS — E7800 Pure hypercholesterolemia, unspecified: Secondary | ICD-10-CM | POA: Diagnosis not present

## 2023-02-06 DIAGNOSIS — E1165 Type 2 diabetes mellitus with hyperglycemia: Secondary | ICD-10-CM | POA: Diagnosis not present

## 2023-02-26 ENCOUNTER — Other Ambulatory Visit: Payer: Self-pay

## 2023-02-26 ENCOUNTER — Other Ambulatory Visit (HOSPITAL_COMMUNITY): Payer: Self-pay

## 2023-03-29 ENCOUNTER — Other Ambulatory Visit (HOSPITAL_COMMUNITY): Payer: Self-pay

## 2023-04-08 ENCOUNTER — Other Ambulatory Visit (HOSPITAL_COMMUNITY): Payer: Self-pay

## 2023-04-09 ENCOUNTER — Other Ambulatory Visit (HOSPITAL_COMMUNITY): Payer: Self-pay

## 2023-04-09 MED ORDER — JARDIANCE 10 MG PO TABS
10.0000 mg | ORAL_TABLET | Freq: Every day | ORAL | 6 refills | Status: DC
  Filled 2023-04-09: qty 30, 30d supply, fill #0
  Filled 2023-05-21: qty 30, 30d supply, fill #1
  Filled 2023-06-21: qty 30, 30d supply, fill #2
  Filled 2023-08-05: qty 30, 30d supply, fill #3
  Filled 2024-02-11: qty 30, 30d supply, fill #4
  Filled 2024-03-23: qty 30, 30d supply, fill #5

## 2023-04-11 ENCOUNTER — Other Ambulatory Visit (HOSPITAL_COMMUNITY): Payer: Self-pay

## 2023-04-23 ENCOUNTER — Other Ambulatory Visit (HOSPITAL_COMMUNITY): Payer: Self-pay

## 2023-04-23 DIAGNOSIS — Z3040 Encounter for surveillance of contraceptives, unspecified: Secondary | ICD-10-CM | POA: Diagnosis not present

## 2023-04-23 DIAGNOSIS — E119 Type 2 diabetes mellitus without complications: Secondary | ICD-10-CM | POA: Diagnosis not present

## 2023-04-23 DIAGNOSIS — Z1211 Encounter for screening for malignant neoplasm of colon: Secondary | ICD-10-CM | POA: Diagnosis not present

## 2023-04-23 DIAGNOSIS — Z1231 Encounter for screening mammogram for malignant neoplasm of breast: Secondary | ICD-10-CM | POA: Diagnosis not present

## 2023-04-23 DIAGNOSIS — Z124 Encounter for screening for malignant neoplasm of cervix: Secondary | ICD-10-CM | POA: Diagnosis not present

## 2023-04-23 DIAGNOSIS — Z6830 Body mass index (BMI) 30.0-30.9, adult: Secondary | ICD-10-CM | POA: Diagnosis not present

## 2023-04-23 DIAGNOSIS — N951 Menopausal and female climacteric states: Secondary | ICD-10-CM | POA: Diagnosis not present

## 2023-04-23 DIAGNOSIS — Z01419 Encounter for gynecological examination (general) (routine) without abnormal findings: Secondary | ICD-10-CM | POA: Diagnosis not present

## 2023-04-23 DIAGNOSIS — Z139 Encounter for screening, unspecified: Secondary | ICD-10-CM | POA: Diagnosis not present

## 2023-04-23 DIAGNOSIS — B009 Herpesviral infection, unspecified: Secondary | ICD-10-CM | POA: Diagnosis not present

## 2023-04-24 ENCOUNTER — Other Ambulatory Visit (HOSPITAL_COMMUNITY): Payer: Self-pay

## 2023-04-25 ENCOUNTER — Other Ambulatory Visit (HOSPITAL_COMMUNITY): Payer: Self-pay

## 2023-04-25 MED ORDER — MOUNJARO 5 MG/0.5ML ~~LOC~~ SOAJ
5.0000 mg | SUBCUTANEOUS | 5 refills | Status: AC
  Filled 2023-04-25: qty 2, 28d supply, fill #0
  Filled 2023-05-21: qty 2, 28d supply, fill #1
  Filled 2023-06-21: qty 2, 28d supply, fill #2
  Filled 2023-07-31: qty 2, 28d supply, fill #3

## 2023-05-24 ENCOUNTER — Other Ambulatory Visit (HOSPITAL_COMMUNITY): Payer: Self-pay

## 2023-05-25 ENCOUNTER — Other Ambulatory Visit (HOSPITAL_COMMUNITY): Payer: Self-pay

## 2023-05-25 ENCOUNTER — Other Ambulatory Visit: Payer: Self-pay

## 2023-05-28 ENCOUNTER — Other Ambulatory Visit (HOSPITAL_COMMUNITY): Payer: Self-pay

## 2023-06-21 ENCOUNTER — Other Ambulatory Visit (HOSPITAL_COMMUNITY): Payer: Self-pay

## 2023-06-21 MED ORDER — ENALAPRIL MALEATE 10 MG PO TABS
10.0000 mg | ORAL_TABLET | Freq: Every day | ORAL | 3 refills | Status: AC
  Filled 2023-06-21: qty 90, 90d supply, fill #0
  Filled 2023-10-09: qty 90, 90d supply, fill #1
  Filled 2024-01-29: qty 90, 90d supply, fill #2
  Filled 2024-05-20: qty 90, 90d supply, fill #3

## 2023-07-03 ENCOUNTER — Other Ambulatory Visit (HOSPITAL_COMMUNITY): Payer: Self-pay

## 2023-07-03 MED ORDER — ATORVASTATIN CALCIUM 20 MG PO TABS
20.0000 mg | ORAL_TABLET | Freq: Every day | ORAL | 3 refills | Status: AC
  Filled 2023-07-03: qty 90, 90d supply, fill #0

## 2023-07-31 ENCOUNTER — Other Ambulatory Visit (HOSPITAL_COMMUNITY): Payer: Self-pay

## 2023-07-31 MED ORDER — FREESTYLE LITE TEST VI STRP
ORAL_STRIP | 11 refills | Status: AC
  Filled 2023-07-31: qty 50, 50d supply, fill #0

## 2023-08-06 ENCOUNTER — Other Ambulatory Visit (HOSPITAL_COMMUNITY): Payer: Self-pay

## 2023-08-09 DIAGNOSIS — E7800 Pure hypercholesterolemia, unspecified: Secondary | ICD-10-CM | POA: Diagnosis not present

## 2023-08-09 DIAGNOSIS — E1165 Type 2 diabetes mellitus with hyperglycemia: Secondary | ICD-10-CM | POA: Diagnosis not present

## 2023-08-09 DIAGNOSIS — R6889 Other general symptoms and signs: Secondary | ICD-10-CM | POA: Diagnosis not present

## 2023-08-14 DIAGNOSIS — H2513 Age-related nuclear cataract, bilateral: Secondary | ICD-10-CM | POA: Diagnosis not present

## 2023-08-14 DIAGNOSIS — E1165 Type 2 diabetes mellitus with hyperglycemia: Secondary | ICD-10-CM | POA: Diagnosis not present

## 2023-08-14 DIAGNOSIS — H52223 Regular astigmatism, bilateral: Secondary | ICD-10-CM | POA: Diagnosis not present

## 2023-08-14 DIAGNOSIS — H524 Presbyopia: Secondary | ICD-10-CM | POA: Diagnosis not present

## 2023-08-14 DIAGNOSIS — H53021 Refractive amblyopia, right eye: Secondary | ICD-10-CM | POA: Diagnosis not present

## 2023-08-16 ENCOUNTER — Other Ambulatory Visit (HOSPITAL_COMMUNITY): Payer: Self-pay

## 2023-08-16 DIAGNOSIS — E7800 Pure hypercholesterolemia, unspecified: Secondary | ICD-10-CM | POA: Diagnosis not present

## 2023-08-16 DIAGNOSIS — E1165 Type 2 diabetes mellitus with hyperglycemia: Secondary | ICD-10-CM | POA: Diagnosis not present

## 2023-08-16 DIAGNOSIS — E559 Vitamin D deficiency, unspecified: Secondary | ICD-10-CM | POA: Diagnosis not present

## 2023-08-16 DIAGNOSIS — I10 Essential (primary) hypertension: Secondary | ICD-10-CM | POA: Diagnosis not present

## 2023-08-16 MED ORDER — MOUNJARO 7.5 MG/0.5ML ~~LOC~~ SOAJ
7.5000 mg | SUBCUTANEOUS | 5 refills | Status: AC
  Filled 2023-08-16: qty 6, 84d supply, fill #0
  Filled 2023-08-29: qty 2, 28d supply, fill #0
  Filled 2023-09-27: qty 2, 28d supply, fill #1
  Filled 2023-10-30: qty 2, 28d supply, fill #2
  Filled 2023-11-25: qty 2, 28d supply, fill #3
  Filled 2023-12-24: qty 2, 28d supply, fill #4

## 2023-08-16 MED ORDER — ATORVASTATIN CALCIUM 10 MG PO TABS
10.0000 mg | ORAL_TABLET | Freq: Every day | ORAL | 3 refills | Status: AC
  Filled 2023-08-16 – 2023-08-29 (×2): qty 90, 90d supply, fill #0
  Filled 2023-12-24: qty 90, 90d supply, fill #1
  Filled 2024-03-31: qty 90, 90d supply, fill #2
  Filled 2024-07-21: qty 90, 90d supply, fill #0

## 2023-08-28 ENCOUNTER — Other Ambulatory Visit (HOSPITAL_COMMUNITY): Payer: Self-pay

## 2023-08-29 ENCOUNTER — Other Ambulatory Visit (HOSPITAL_COMMUNITY): Payer: Self-pay

## 2023-10-10 ENCOUNTER — Other Ambulatory Visit (HOSPITAL_COMMUNITY): Payer: Self-pay

## 2023-10-10 ENCOUNTER — Encounter (HOSPITAL_COMMUNITY): Payer: Self-pay

## 2023-10-11 ENCOUNTER — Other Ambulatory Visit (HOSPITAL_COMMUNITY): Payer: Self-pay

## 2023-10-11 MED ORDER — VALACYCLOVIR HCL 500 MG PO TABS
500.0000 mg | ORAL_TABLET | Freq: Two times a day (BID) | ORAL | 6 refills | Status: AC
  Filled 2023-10-11: qty 36, 18d supply, fill #0

## 2023-10-31 ENCOUNTER — Other Ambulatory Visit (HOSPITAL_COMMUNITY): Payer: Self-pay

## 2023-12-03 ENCOUNTER — Other Ambulatory Visit (HOSPITAL_COMMUNITY): Payer: Self-pay

## 2024-01-14 ENCOUNTER — Other Ambulatory Visit (HOSPITAL_COMMUNITY): Payer: Self-pay

## 2024-01-14 DIAGNOSIS — R635 Abnormal weight gain: Secondary | ICD-10-CM | POA: Diagnosis not present

## 2024-01-14 DIAGNOSIS — I10 Essential (primary) hypertension: Secondary | ICD-10-CM | POA: Diagnosis not present

## 2024-01-14 DIAGNOSIS — E1165 Type 2 diabetes mellitus with hyperglycemia: Secondary | ICD-10-CM | POA: Diagnosis not present

## 2024-01-14 DIAGNOSIS — E559 Vitamin D deficiency, unspecified: Secondary | ICD-10-CM | POA: Diagnosis not present

## 2024-01-14 DIAGNOSIS — E7800 Pure hypercholesterolemia, unspecified: Secondary | ICD-10-CM | POA: Diagnosis not present

## 2024-01-14 MED ORDER — MOUNJARO 10 MG/0.5ML ~~LOC~~ SOAJ
10.0000 mg | SUBCUTANEOUS | 1 refills | Status: DC
  Filled 2024-01-14 – 2024-01-16 (×2): qty 2, 28d supply, fill #0
  Filled 2024-02-18: qty 2, 28d supply, fill #1

## 2024-01-14 MED ORDER — JARDIANCE 10 MG PO TABS
10.0000 mg | ORAL_TABLET | Freq: Every day | ORAL | 0 refills | Status: AC
  Filled 2024-01-14: qty 30, 30d supply, fill #0

## 2024-01-16 ENCOUNTER — Other Ambulatory Visit (HOSPITAL_COMMUNITY): Payer: Self-pay

## 2024-01-16 MED ORDER — FREESTYLE LANCETS MISC
1.0000 | Freq: Every day | 11 refills | Status: AC
  Filled 2024-01-16: qty 100, 90d supply, fill #0

## 2024-02-08 ENCOUNTER — Other Ambulatory Visit: Payer: Self-pay | Admitting: Obstetrics and Gynecology

## 2024-02-08 DIAGNOSIS — Z1231 Encounter for screening mammogram for malignant neoplasm of breast: Secondary | ICD-10-CM

## 2024-02-11 ENCOUNTER — Other Ambulatory Visit (HOSPITAL_COMMUNITY): Payer: Self-pay

## 2024-02-14 DIAGNOSIS — E559 Vitamin D deficiency, unspecified: Secondary | ICD-10-CM | POA: Diagnosis not present

## 2024-02-14 DIAGNOSIS — E7800 Pure hypercholesterolemia, unspecified: Secondary | ICD-10-CM | POA: Diagnosis not present

## 2024-02-14 DIAGNOSIS — E1165 Type 2 diabetes mellitus with hyperglycemia: Secondary | ICD-10-CM | POA: Diagnosis not present

## 2024-02-18 ENCOUNTER — Other Ambulatory Visit (HOSPITAL_COMMUNITY): Payer: Self-pay

## 2024-02-18 DIAGNOSIS — E1165 Type 2 diabetes mellitus with hyperglycemia: Secondary | ICD-10-CM | POA: Diagnosis not present

## 2024-02-18 DIAGNOSIS — E7800 Pure hypercholesterolemia, unspecified: Secondary | ICD-10-CM | POA: Diagnosis not present

## 2024-02-18 DIAGNOSIS — E559 Vitamin D deficiency, unspecified: Secondary | ICD-10-CM | POA: Diagnosis not present

## 2024-02-18 DIAGNOSIS — I10 Essential (primary) hypertension: Secondary | ICD-10-CM | POA: Diagnosis not present

## 2024-03-11 ENCOUNTER — Other Ambulatory Visit (HOSPITAL_COMMUNITY): Payer: Self-pay

## 2024-03-11 MED ORDER — MOUNJARO 10 MG/0.5ML ~~LOC~~ SOAJ
10.0000 mg | SUBCUTANEOUS | 1 refills | Status: AC
  Filled 2024-03-11 – 2024-03-12 (×2): qty 6, 84d supply, fill #0
  Filled 2024-06-02: qty 6, 84d supply, fill #1

## 2024-03-12 ENCOUNTER — Other Ambulatory Visit (HOSPITAL_COMMUNITY): Payer: Self-pay

## 2024-03-14 ENCOUNTER — Other Ambulatory Visit (HOSPITAL_COMMUNITY): Payer: Self-pay

## 2024-04-23 ENCOUNTER — Ambulatory Visit

## 2024-04-23 DIAGNOSIS — E119 Type 2 diabetes mellitus without complications: Secondary | ICD-10-CM | POA: Diagnosis not present

## 2024-04-23 DIAGNOSIS — Z01419 Encounter for gynecological examination (general) (routine) without abnormal findings: Secondary | ICD-10-CM | POA: Diagnosis not present

## 2024-04-23 DIAGNOSIS — E785 Hyperlipidemia, unspecified: Secondary | ICD-10-CM | POA: Diagnosis not present

## 2024-04-23 DIAGNOSIS — I10 Essential (primary) hypertension: Secondary | ICD-10-CM | POA: Diagnosis not present

## 2024-04-23 DIAGNOSIS — Z3040 Encounter for surveillance of contraceptives, unspecified: Secondary | ICD-10-CM | POA: Diagnosis not present

## 2024-04-23 DIAGNOSIS — Z6829 Body mass index (BMI) 29.0-29.9, adult: Secondary | ICD-10-CM | POA: Diagnosis not present

## 2024-04-23 DIAGNOSIS — E663 Overweight: Secondary | ICD-10-CM | POA: Diagnosis not present

## 2024-04-23 DIAGNOSIS — B009 Herpesviral infection, unspecified: Secondary | ICD-10-CM | POA: Diagnosis not present

## 2024-04-23 DIAGNOSIS — Z1231 Encounter for screening mammogram for malignant neoplasm of breast: Secondary | ICD-10-CM | POA: Diagnosis not present

## 2024-04-23 DIAGNOSIS — Z1211 Encounter for screening for malignant neoplasm of colon: Secondary | ICD-10-CM | POA: Diagnosis not present

## 2024-04-28 ENCOUNTER — Ambulatory Visit
Admission: RE | Admit: 2024-04-28 | Discharge: 2024-04-28 | Disposition: A | Source: Ambulatory Visit | Attending: Obstetrics and Gynecology | Admitting: Obstetrics and Gynecology

## 2024-04-28 DIAGNOSIS — Z1231 Encounter for screening mammogram for malignant neoplasm of breast: Secondary | ICD-10-CM | POA: Diagnosis not present

## 2024-05-01 ENCOUNTER — Other Ambulatory Visit (HOSPITAL_COMMUNITY): Payer: Self-pay

## 2024-05-02 ENCOUNTER — Other Ambulatory Visit (HOSPITAL_COMMUNITY): Payer: Self-pay

## 2024-05-02 MED ORDER — JARDIANCE 10 MG PO TABS
10.0000 mg | ORAL_TABLET | Freq: Every day | ORAL | 6 refills | Status: AC
  Filled 2024-05-02: qty 30, 30d supply, fill #0
  Filled 2024-06-02: qty 30, 30d supply, fill #1
  Filled 2024-07-07 (×2): qty 30, 30d supply, fill #0
  Filled 2024-08-08: qty 30, 30d supply, fill #1
  Filled 2024-08-11 – 2024-08-12 (×2): qty 90, 90d supply, fill #1

## 2024-06-02 ENCOUNTER — Other Ambulatory Visit (HOSPITAL_COMMUNITY): Payer: Self-pay

## 2024-06-03 ENCOUNTER — Other Ambulatory Visit: Payer: Self-pay

## 2024-06-03 ENCOUNTER — Other Ambulatory Visit (HOSPITAL_COMMUNITY): Payer: Self-pay

## 2024-07-07 ENCOUNTER — Other Ambulatory Visit (HOSPITAL_COMMUNITY): Payer: Self-pay

## 2024-07-07 ENCOUNTER — Other Ambulatory Visit: Payer: Self-pay

## 2024-07-08 ENCOUNTER — Other Ambulatory Visit: Payer: Self-pay

## 2024-07-18 ENCOUNTER — Other Ambulatory Visit: Payer: Self-pay

## 2024-07-21 ENCOUNTER — Other Ambulatory Visit (HOSPITAL_COMMUNITY): Payer: Self-pay

## 2024-08-08 ENCOUNTER — Other Ambulatory Visit: Payer: Self-pay

## 2024-08-11 ENCOUNTER — Other Ambulatory Visit: Payer: Self-pay

## 2024-08-12 ENCOUNTER — Other Ambulatory Visit: Payer: Self-pay

## 2024-08-13 ENCOUNTER — Other Ambulatory Visit: Payer: Self-pay

## 2024-08-14 ENCOUNTER — Other Ambulatory Visit: Payer: Self-pay
